# Patient Record
Sex: Male | Born: 1960 | Race: Black or African American | Hispanic: No | Marital: Single
Health system: Southern US, Community
[De-identification: ages and names within clinical notes are randomized; demographics above are authoritative.]

## PROBLEM LIST (undated history)

## (undated) DIAGNOSIS — E785 Hyperlipidemia, unspecified: Secondary | ICD-10-CM

## (undated) DIAGNOSIS — I10 Essential (primary) hypertension: Secondary | ICD-10-CM

## (undated) DIAGNOSIS — K859 Acute pancreatitis without necrosis or infection, unspecified: Secondary | ICD-10-CM

## (undated) DIAGNOSIS — I509 Heart failure, unspecified: Secondary | ICD-10-CM

## (undated) DIAGNOSIS — B1920 Unspecified viral hepatitis C without hepatic coma: Secondary | ICD-10-CM

## (undated) DIAGNOSIS — E119 Type 2 diabetes mellitus without complications: Secondary | ICD-10-CM

## (undated) HISTORY — PX: BELOW KNEE LEG AMPUTATION: SUR23

---

## 2009-07-08 HISTORY — PX: WHIPPLE PROCEDURE: SHX2667

## 2015-07-12 ENCOUNTER — Inpatient Hospital Stay (HOSPITAL_COMMUNITY)
Admission: AD | Admit: 2015-07-12 | Discharge: 2015-08-09 | DRG: 564 | Disposition: E | Payer: Medicare Other | Source: Other Acute Inpatient Hospital | Attending: Internal Medicine | Admitting: Internal Medicine

## 2015-07-12 ENCOUNTER — Inpatient Hospital Stay (HOSPITAL_COMMUNITY): Payer: Medicare Other

## 2015-07-12 ENCOUNTER — Encounter (HOSPITAL_COMMUNITY): Payer: Self-pay | Admitting: Pulmonary Disease

## 2015-07-12 DIAGNOSIS — D62 Acute posthemorrhagic anemia: Secondary | ICD-10-CM | POA: Diagnosis present

## 2015-07-12 DIAGNOSIS — I10 Essential (primary) hypertension: Secondary | ICD-10-CM | POA: Diagnosis present

## 2015-07-12 DIAGNOSIS — Z89512 Acquired absence of left leg below knee: Secondary | ICD-10-CM | POA: Diagnosis not present

## 2015-07-12 DIAGNOSIS — B951 Streptococcus, group B, as the cause of diseases classified elsewhere: Secondary | ICD-10-CM | POA: Diagnosis present

## 2015-07-12 DIAGNOSIS — G9341 Metabolic encephalopathy: Secondary | ICD-10-CM | POA: Diagnosis present

## 2015-07-12 DIAGNOSIS — G9340 Encephalopathy, unspecified: Secondary | ICD-10-CM | POA: Diagnosis not present

## 2015-07-12 DIAGNOSIS — Z978 Presence of other specified devices: Secondary | ICD-10-CM

## 2015-07-12 DIAGNOSIS — J189 Pneumonia, unspecified organism: Secondary | ICD-10-CM

## 2015-07-12 DIAGNOSIS — J9601 Acute respiratory failure with hypoxia: Secondary | ICD-10-CM | POA: Diagnosis present

## 2015-07-12 DIAGNOSIS — K529 Noninfective gastroenteritis and colitis, unspecified: Secondary | ICD-10-CM | POA: Diagnosis not present

## 2015-07-12 DIAGNOSIS — E440 Moderate protein-calorie malnutrition: Secondary | ICD-10-CM | POA: Diagnosis present

## 2015-07-12 DIAGNOSIS — I5032 Chronic diastolic (congestive) heart failure: Secondary | ICD-10-CM | POA: Diagnosis present

## 2015-07-12 DIAGNOSIS — A419 Sepsis, unspecified organism: Secondary | ICD-10-CM

## 2015-07-12 DIAGNOSIS — M868X6 Other osteomyelitis, lower leg: Secondary | ICD-10-CM | POA: Diagnosis present

## 2015-07-12 DIAGNOSIS — K7469 Other cirrhosis of liver: Secondary | ICD-10-CM

## 2015-07-12 DIAGNOSIS — R34 Anuria and oliguria: Secondary | ICD-10-CM | POA: Diagnosis not present

## 2015-07-12 DIAGNOSIS — I469 Cardiac arrest, cause unspecified: Secondary | ICD-10-CM | POA: Diagnosis not present

## 2015-07-12 DIAGNOSIS — R5381 Other malaise: Secondary | ICD-10-CM | POA: Diagnosis not present

## 2015-07-12 DIAGNOSIS — D696 Thrombocytopenia, unspecified: Secondary | ICD-10-CM | POA: Diagnosis present

## 2015-07-12 DIAGNOSIS — Z452 Encounter for adjustment and management of vascular access device: Secondary | ICD-10-CM

## 2015-07-12 DIAGNOSIS — E46 Unspecified protein-calorie malnutrition: Secondary | ICD-10-CM

## 2015-07-12 DIAGNOSIS — B1920 Unspecified viral hepatitis C without hepatic coma: Secondary | ICD-10-CM | POA: Diagnosis present

## 2015-07-12 DIAGNOSIS — I509 Heart failure, unspecified: Secondary | ICD-10-CM | POA: Diagnosis not present

## 2015-07-12 DIAGNOSIS — R001 Bradycardia, unspecified: Secondary | ICD-10-CM | POA: Diagnosis not present

## 2015-07-12 DIAGNOSIS — Y95 Nosocomial condition: Secondary | ICD-10-CM | POA: Diagnosis present

## 2015-07-12 DIAGNOSIS — L8990 Pressure ulcer of unspecified site, unspecified stage: Secondary | ICD-10-CM | POA: Insufficient documentation

## 2015-07-12 DIAGNOSIS — T879 Unspecified complications of amputation stump: Secondary | ICD-10-CM | POA: Diagnosis not present

## 2015-07-12 DIAGNOSIS — J156 Pneumonia due to other aerobic Gram-negative bacteria: Secondary | ICD-10-CM | POA: Diagnosis present

## 2015-07-12 DIAGNOSIS — R140 Abdominal distension (gaseous): Secondary | ICD-10-CM

## 2015-07-12 DIAGNOSIS — E785 Hyperlipidemia, unspecified: Secondary | ICD-10-CM | POA: Diagnosis present

## 2015-07-12 DIAGNOSIS — Z794 Long term (current) use of insulin: Secondary | ICD-10-CM

## 2015-07-12 DIAGNOSIS — J80 Acute respiratory distress syndrome: Secondary | ICD-10-CM | POA: Diagnosis not present

## 2015-07-12 DIAGNOSIS — E1169 Type 2 diabetes mellitus with other specified complication: Secondary | ICD-10-CM | POA: Diagnosis present

## 2015-07-12 DIAGNOSIS — K567 Ileus, unspecified: Secondary | ICD-10-CM | POA: Diagnosis not present

## 2015-07-12 DIAGNOSIS — R569 Unspecified convulsions: Secondary | ICD-10-CM | POA: Insufficient documentation

## 2015-07-12 DIAGNOSIS — K912 Postsurgical malabsorption, not elsewhere classified: Secondary | ICD-10-CM | POA: Diagnosis present

## 2015-07-12 DIAGNOSIS — Z4659 Encounter for fitting and adjustment of other gastrointestinal appliance and device: Secondary | ICD-10-CM

## 2015-07-12 DIAGNOSIS — N179 Acute kidney failure, unspecified: Secondary | ICD-10-CM | POA: Diagnosis not present

## 2015-07-12 DIAGNOSIS — L89152 Pressure ulcer of sacral region, stage 2: Secondary | ICD-10-CM | POA: Diagnosis not present

## 2015-07-12 DIAGNOSIS — L97929 Non-pressure chronic ulcer of unspecified part of left lower leg with unspecified severity: Secondary | ICD-10-CM | POA: Diagnosis present

## 2015-07-12 DIAGNOSIS — T8744 Infection of amputation stump, left lower extremity: Secondary | ICD-10-CM | POA: Diagnosis present

## 2015-07-12 DIAGNOSIS — E872 Acidosis: Secondary | ICD-10-CM | POA: Diagnosis not present

## 2015-07-12 DIAGNOSIS — G40409 Other generalized epilepsy and epileptic syndromes, not intractable, without status epilepticus: Secondary | ICD-10-CM | POA: Diagnosis not present

## 2015-07-12 DIAGNOSIS — Z79899 Other long term (current) drug therapy: Secondary | ICD-10-CM

## 2015-07-12 DIAGNOSIS — Z6821 Body mass index (BMI) 21.0-21.9, adult: Secondary | ICD-10-CM | POA: Diagnosis not present

## 2015-07-12 DIAGNOSIS — E11649 Type 2 diabetes mellitus with hypoglycemia without coma: Secondary | ICD-10-CM | POA: Diagnosis present

## 2015-07-12 DIAGNOSIS — Z66 Do not resuscitate: Secondary | ICD-10-CM | POA: Diagnosis not present

## 2015-07-12 DIAGNOSIS — T40605A Adverse effect of unspecified narcotics, initial encounter: Secondary | ICD-10-CM | POA: Diagnosis not present

## 2015-07-12 DIAGNOSIS — K56609 Unspecified intestinal obstruction, unspecified as to partial versus complete obstruction: Secondary | ICD-10-CM

## 2015-07-12 DIAGNOSIS — J9600 Acute respiratory failure, unspecified whether with hypoxia or hypercapnia: Secondary | ICD-10-CM

## 2015-07-12 DIAGNOSIS — K7460 Unspecified cirrhosis of liver: Secondary | ICD-10-CM | POA: Diagnosis present

## 2015-07-12 DIAGNOSIS — T68XXXA Hypothermia, initial encounter: Secondary | ICD-10-CM | POA: Diagnosis present

## 2015-07-12 DIAGNOSIS — K922 Gastrointestinal hemorrhage, unspecified: Secondary | ICD-10-CM

## 2015-07-12 DIAGNOSIS — J9690 Respiratory failure, unspecified, unspecified whether with hypoxia or hypercapnia: Secondary | ICD-10-CM | POA: Diagnosis present

## 2015-07-12 DIAGNOSIS — K2961 Other gastritis with bleeding: Secondary | ICD-10-CM | POA: Diagnosis present

## 2015-07-12 DIAGNOSIS — R0902 Hypoxemia: Secondary | ICD-10-CM | POA: Diagnosis present

## 2015-07-12 DIAGNOSIS — E876 Hypokalemia: Secondary | ICD-10-CM | POA: Diagnosis present

## 2015-07-12 HISTORY — DX: Hyperlipidemia, unspecified: E78.5

## 2015-07-12 HISTORY — DX: Unspecified viral hepatitis C without hepatic coma: B19.20

## 2015-07-12 HISTORY — DX: Heart failure, unspecified: I50.9

## 2015-07-12 HISTORY — DX: Essential (primary) hypertension: I10

## 2015-07-12 HISTORY — DX: Acute pancreatitis without necrosis or infection, unspecified: K85.90

## 2015-07-12 HISTORY — DX: Type 2 diabetes mellitus without complications: E11.9

## 2015-07-12 LAB — LACTIC ACID, PLASMA: Lactic Acid, Venous: 2.1 mmol/L (ref 0.5–2.0)

## 2015-07-12 LAB — GLUCOSE, CAPILLARY
GLUCOSE-CAPILLARY: 80 mg/dL (ref 65–99)
GLUCOSE-CAPILLARY: 89 mg/dL (ref 65–99)
Glucose-Capillary: 103 mg/dL — ABNORMAL HIGH (ref 65–99)
Glucose-Capillary: 114 mg/dL — ABNORMAL HIGH (ref 65–99)
Glucose-Capillary: 128 mg/dL — ABNORMAL HIGH (ref 65–99)
Glucose-Capillary: 154 mg/dL — ABNORMAL HIGH (ref 65–99)
Glucose-Capillary: 54 mg/dL — ABNORMAL LOW (ref 65–99)
Glucose-Capillary: <10 mg/dL — CL (ref 65–99)

## 2015-07-12 LAB — RAPID URINE DRUG SCREEN, HOSP PERFORMED
Amphetamines: NOT DETECTED
Barbiturates: NOT DETECTED
Benzodiazepines: POSITIVE — AB
Cocaine: NOT DETECTED
OPIATES: POSITIVE — AB
TETRAHYDROCANNABINOL: NOT DETECTED

## 2015-07-12 LAB — HEPATIC FUNCTION PANEL
ALT: 19 U/L (ref 17–63)
AST: 41 U/L (ref 15–41)
Albumin: <1 g/dL — ABNORMAL LOW (ref 3.5–5.0)
Alkaline Phosphatase: 919 U/L — ABNORMAL HIGH (ref 38–126)
BILIRUBIN DIRECT: 0.5 mg/dL (ref 0.1–0.5)
BILIRUBIN INDIRECT: 0.5 mg/dL (ref 0.3–0.9)
Total Bilirubin: 1 mg/dL (ref 0.3–1.2)
Total Protein: 5.5 g/dL — ABNORMAL LOW (ref 6.5–8.1)

## 2015-07-12 LAB — HEMOGLOBIN AND HEMATOCRIT, BLOOD
HCT: 27.2 % — ABNORMAL LOW (ref 39.0–52.0)
HEMATOCRIT: 24.3 % — AB (ref 39.0–52.0)
HEMOGLOBIN: 9.7 g/dL — AB (ref 13.0–17.0)
Hemoglobin: 8.6 g/dL — ABNORMAL LOW (ref 13.0–17.0)

## 2015-07-12 LAB — TRIGLYCERIDES: TRIGLYCERIDES: 128 mg/dL (ref <150)

## 2015-07-12 LAB — BASIC METABOLIC PANEL
Anion gap: 5 (ref 5–15)
Anion gap: 8 (ref 5–15)
BUN: 8 mg/dL (ref 6–20)
BUN: 10 mg/dL (ref 6–20)
CHLORIDE: 107 mmol/L (ref 101–111)
CHLORIDE: 104 mmol/L (ref 101–111)
CO2: 30 mmol/L (ref 22–32)
CO2: 26 mmol/L (ref 22–32)
CREATININE: 0.84 mg/dL (ref 0.61–1.24)
CREATININE: 0.99 mg/dL (ref 0.61–1.24)
Calcium: 7.2 mg/dL — ABNORMAL LOW (ref 8.9–10.3)
Calcium: 7 mg/dL — ABNORMAL LOW (ref 8.9–10.3)
GFR calc Af Amer: >60 mL/min (ref >60)
GFR calc non Af Amer: >60 mL/min (ref >60)
GFR calc non Af Amer: >60 mL/min (ref >60)
Glucose, Bld: <20 mg/dL — CL (ref 65–99)
Glucose, Bld: 135 mg/dL — ABNORMAL HIGH (ref 65–99)
Potassium: 2.8 mmol/L — ABNORMAL LOW (ref 3.5–5.1)
Potassium: 3.1 mmol/L — ABNORMAL LOW (ref 3.5–5.1)
SODIUM: 142 mmol/L (ref 135–145)
Sodium: 138 mmol/L (ref 135–145)

## 2015-07-12 LAB — PROTIME-INR
INR: 1.24 (ref 0.00–1.49)
PROTHROMBIN TIME: 15.8 s — AB (ref 11.6–15.2)

## 2015-07-12 LAB — POCT I-STAT 3, ART BLOOD GAS (G3+)
ACID-BASE EXCESS: 4 mmol/L — AB (ref 0.0–2.0)
BICARBONATE: 29.7 meq/L — AB (ref 20.0–24.0)
O2 Saturation: 99 %
PH ART: 7.403 (ref 7.350–7.450)
TCO2: 31 mmol/L (ref 0–100)
pCO2 arterial: 47.4 mmHg — ABNORMAL HIGH (ref 35.0–45.0)
pO2, Arterial: 145 mmHg — ABNORMAL HIGH (ref 80.0–100.0)

## 2015-07-12 LAB — HIV ANTIBODY (ROUTINE TESTING W REFLEX): HIV SCREEN 4TH GENERATION: NONREACTIVE

## 2015-07-12 LAB — CBC
HCT: 28.3 % — ABNORMAL LOW (ref 39.0–52.0)
Hemoglobin: 10 g/dL — ABNORMAL LOW (ref 13.0–17.0)
MCH: 29.2 pg (ref 26.0–34.0)
MCHC: 35.3 g/dL (ref 30.0–36.0)
MCV: 82.5 fL (ref 78.0–100.0)
PLATELETS: 127 10*3/uL — AB (ref 150–400)
RBC: 3.43 MIL/uL — ABNORMAL LOW (ref 4.22–5.81)
RDW: 14.6 % (ref 11.5–15.5)
WBC: 2.8 10*3/uL — AB (ref 4.0–10.5)

## 2015-07-12 LAB — PHOSPHORUS: Phosphorus: 3.1 mg/dL (ref 2.5–4.6)

## 2015-07-12 LAB — MRSA PCR SCREENING: MRSA by PCR: POSITIVE — AB

## 2015-07-12 LAB — PROCALCITONIN: PROCALCITONIN: 1.75 ng/mL

## 2015-07-12 LAB — MAGNESIUM: MAGNESIUM: 1.7 mg/dL (ref 1.7–2.4)

## 2015-07-12 MED ORDER — SODIUM CHLORIDE 0.9 % IV SOLN
80.0000 mg | Freq: Once | INTRAVENOUS | Status: DC
  Filled 2015-07-12: qty 80

## 2015-07-12 MED ORDER — POTASSIUM CHLORIDE 10 MEQ/50ML IV SOLN
10.0000 meq | INTRAVENOUS | Status: DC
  Administered 2015-07-12 – 2015-07-13 (×5): 10 meq via INTRAVENOUS
  Filled 2015-07-12 (×6): qty 50

## 2015-07-12 MED ORDER — OXYBUTYNIN 3.9 MG/24HR TD PTTW
1.0000 | MEDICATED_PATCH | TRANSDERMAL | Status: DC
  Administered 2015-07-12: 1 via TRANSDERMAL
  Filled 2015-07-12 (×2): qty 1

## 2015-07-12 MED ORDER — DEXTROSE 10 % IV SOLN
INTRAVENOUS | Status: DC
  Administered 2015-07-12 (×2): via INTRAVENOUS

## 2015-07-12 MED ORDER — FENTANYL CITRATE (PF) 100 MCG/2ML IJ SOLN
100.0000 ug | INTRAMUSCULAR | Status: DC | PRN
  Administered 2015-07-13 (×2): 100 ug via INTRAVENOUS
  Filled 2015-07-12 (×2): qty 2

## 2015-07-12 MED ORDER — MAGNESIUM SULFATE IN D5W 10-5 MG/ML-% IV SOLN
1.0000 g | Freq: Once | INTRAVENOUS | Status: AC
  Administered 2015-07-12: 1 g via INTRAVENOUS
  Filled 2015-07-12: qty 100

## 2015-07-12 MED ORDER — DEXTROSE 50 % IV SOLN
INTRAVENOUS | Status: AC
Start: 2015-07-12 — End: 2015-07-12
  Administered 2015-07-12: 50 mL
  Filled 2015-07-12: qty 50

## 2015-07-12 MED ORDER — LABETALOL HCL 5 MG/ML IV SOLN
10.0000 mg | INTRAVENOUS | Status: DC | PRN
Start: 2015-07-12 — End: 2015-07-27
  Administered 2015-07-12 – 2015-07-23 (×7): 10 mg via INTRAVENOUS
  Filled 2015-07-12 (×9): qty 4

## 2015-07-12 MED ORDER — CHLORHEXIDINE GLUCONATE 0.12% ORAL RINSE (MEDLINE KIT)
15.0000 mL | Freq: Two times a day (BID) | OROMUCOSAL | Status: DC
  Administered 2015-07-12 – 2015-07-26 (×30): 15 mL via OROMUCOSAL

## 2015-07-12 MED ORDER — VANCOMYCIN HCL IN DEXTROSE 1-5 GM/200ML-% IV SOLN
1000.0000 mg | Freq: Three times a day (TID) | INTRAVENOUS | Status: DC
  Administered 2015-07-12 – 2015-07-13 (×3): 1000 mg via INTRAVENOUS
  Filled 2015-07-12 (×5): qty 200

## 2015-07-12 MED ORDER — THIAMINE HCL 100 MG/ML IJ SOLN
100.0000 mg | Freq: Every day | INTRAMUSCULAR | Status: DC
  Administered 2015-07-12 – 2015-07-13 (×2): 100 mg via INTRAVENOUS
  Filled 2015-07-12 (×2): qty 2

## 2015-07-12 MED ORDER — FENTANYL CITRATE (PF) 100 MCG/2ML IJ SOLN: 100.0000 ug | INTRAMUSCULAR | Status: DC | PRN

## 2015-07-12 MED ORDER — MUPIROCIN 2 % EX OINT
1.0000 "application " | TOPICAL_OINTMENT | Freq: Two times a day (BID) | CUTANEOUS | Status: AC
  Administered 2015-07-12 – 2015-07-16 (×10): 1 via NASAL
  Filled 2015-07-12 (×2): qty 22

## 2015-07-12 MED ORDER — PROPOFOL 1000 MG/100ML IV EMUL
0.0000 ug/kg/min | INTRAVENOUS | Status: DC
  Administered 2015-07-12: 35 ug/kg/min via INTRAVENOUS
  Administered 2015-07-13 – 2015-07-15 (×3): 10 ug/kg/min via INTRAVENOUS
  Administered 2015-07-16: 20 ug/kg/min via INTRAVENOUS
  Administered 2015-07-16: 25 ug/kg/min via INTRAVENOUS
  Administered 2015-07-17 (×2): 20 ug/kg/min via INTRAVENOUS
  Administered 2015-07-17: 25 ug/kg/min via INTRAVENOUS
  Administered 2015-07-18: 10 ug/kg/min via INTRAVENOUS
  Filled 2015-07-12 (×12): qty 100

## 2015-07-12 MED ORDER — INSULIN ASPART 100 UNIT/ML ~~LOC~~ SOLN: 2.0000 [IU] | SUBCUTANEOUS | Status: DC

## 2015-07-12 MED ORDER — POTASSIUM CHLORIDE 10 MEQ/50ML IV SOLN
10.0000 meq | INTRAVENOUS | Status: AC
  Administered 2015-07-12 (×4): 10 meq via INTRAVENOUS

## 2015-07-12 MED ORDER — CHLORHEXIDINE GLUCONATE CLOTH 2 % EX PADS
6.0000 | MEDICATED_PAD | Freq: Every day | CUTANEOUS | Status: AC
Start: 2015-07-12 — End: 2015-07-16
  Administered 2015-07-12 – 2015-07-16 (×5): 6 via TOPICAL

## 2015-07-12 MED ORDER — SODIUM CHLORIDE 0.9 % IJ SOLN
10.0000 mL | Freq: Two times a day (BID) | INTRAMUSCULAR | Status: DC
  Administered 2015-07-12 – 2015-07-16 (×7): 10 mL
  Administered 2015-07-17: 40 mL
  Administered 2015-07-18 – 2015-07-24 (×10): 10 mL
  Administered 2015-07-25: 20 mL

## 2015-07-12 MED ORDER — DEXTROSE 50 % IV SOLN
INTRAVENOUS | Status: AC
  Administered 2015-07-12: 50 mL
  Filled 2015-07-12: qty 50

## 2015-07-12 MED ORDER — PROPOFOL 1000 MG/100ML IV EMUL
0.0000 ug/kg/min | INTRAVENOUS | Status: DC
  Administered 2015-07-12: 35 ug/kg/min via INTRAVENOUS
  Administered 2015-07-12: 25 ug/kg/min via INTRAVENOUS

## 2015-07-12 MED ORDER — FOLIC ACID 5 MG/ML IJ SOLN
1.0000 mg | Freq: Every day | INTRAMUSCULAR | Status: DC
  Administered 2015-07-12 – 2015-07-13 (×2): 1 mg via INTRAVENOUS
  Filled 2015-07-12 (×3): qty 0.2

## 2015-07-12 MED ORDER — SODIUM CHLORIDE 0.9 % IV SOLN
8.0000 mg/h | INTRAVENOUS | Status: AC
  Administered 2015-07-12 – 2015-07-15 (×6): 8 mg/h via INTRAVENOUS
  Filled 2015-07-12 (×15): qty 80

## 2015-07-12 MED ORDER — ALBUTEROL SULFATE (2.5 MG/3ML) 0.083% IN NEBU: 2.5000 mg | INHALATION_SOLUTION | RESPIRATORY_TRACT | Status: DC | PRN

## 2015-07-12 MED ORDER — POTASSIUM CHLORIDE 10 MEQ/50ML IV SOLN
INTRAVENOUS | Status: AC
  Filled 2015-07-12: qty 200

## 2015-07-12 MED ORDER — SODIUM CHLORIDE 0.9 % IJ SOLN
10.0000 mL | INTRAMUSCULAR | Status: DC | PRN
  Administered 2015-07-14: 10 mL
  Administered 2015-07-23: 20 mL
  Administered 2015-07-23: 10 mL
  Administered 2015-07-24: 30 mL
  Administered 2015-07-24 – 2015-07-25 (×2): 10 mL
  Filled 2015-07-12 (×6): qty 40

## 2015-07-12 MED ORDER — PROPOFOL 1000 MG/100ML IV EMUL
INTRAVENOUS | Status: AC
  Filled 2015-07-12: qty 100

## 2015-07-12 MED ORDER — PIPERACILLIN-TAZOBACTAM 3.375 G IVPB
3.3750 g | Freq: Three times a day (TID) | INTRAVENOUS | Status: DC
  Administered 2015-07-12 – 2015-07-15 (×9): 3.375 g via INTRAVENOUS
  Filled 2015-07-12 (×11): qty 50

## 2015-07-12 MED ORDER — PANTOPRAZOLE SODIUM 40 MG IV SOLR
40.0000 mg | Freq: Two times a day (BID) | INTRAVENOUS | Status: DC
  Administered 2015-07-15 – 2015-07-20 (×11): 40 mg via INTRAVENOUS
  Filled 2015-07-12 (×11): qty 40

## 2015-07-12 MED ORDER — ANTISEPTIC ORAL RINSE SOLUTION (CORINZ)
7.0000 mL | OROMUCOSAL | Status: DC
  Administered 2015-07-12 – 2015-07-27 (×134): 7 mL via OROMUCOSAL

## 2015-07-12 MED ORDER — PANTOPRAZOLE SODIUM 40 MG IV SOLR
40.0000 mg | Freq: Every day | INTRAVENOUS | Status: DC
Start: 2015-07-12 — End: 2015-07-12

## 2015-07-12 NOTE — Progress Notes (Signed)
Hypoglycemic Event  CBG: 54  Treatment: D50 IV 50 mL  Symptoms: None  Follow-up CBG: Time:1412 CBG Result:154  Possible Reasons for Event: Unknown  Comments/MD notified: MD aware    Victor Mcdonald, Victor Mcdonald

## 2015-07-12 NOTE — Progress Notes (Signed)
ANTIBIOTIC CONSULT NOTE - INITIAL  Pharmacy Consult for zosyn + vancomycin Indication: rule out pneumonia  No Known Allergies  Patient Measurements:   Adjusted Body Weight:   Vital Signs: BP: 184/108 mmHg (01/04 0733) Pulse Rate: 88 (01/04 0733) Intake/Output from previous day:   Intake/Output from this shift:    Labs:  Recent Labs  2016/03/16 0902  WBC 2.8*  HGB 10.0*  PLT 127*  CREATININE 0.84   CrCl cannot be calculated (Unknown ideal weight.). No results for input(s): VANCOTROUGH, VANCOPEAK, VANCORANDOM, GENTTROUGH, GENTPEAK, GENTRANDOM, TOBRATROUGH, TOBRAPEAK, TOBRARND, AMIKACINPEAK, AMIKACINTROU, AMIKACIN in the last 72 hours.   Microbiology: Recent Results (from the past 720 hour(s))  MRSA PCR Screening     Status: Abnormal   Collection Time: 2016/03/16  6:30 AM  Result Value Ref Range Status   MRSA by PCR POSITIVE (A) NEGATIVE Final    Comment:        The GeneXpert MRSA Assay (FDA approved for NASAL specimens only), is one component of a comprehensive MRSA colonization surveillance program. It is not intended to diagnose MRSA infection nor to guide or monitor treatment for MRSA infections. RESULT CALLED TO, READ BACK BY AND VERIFIED WITH: Gerhard Perches PARKER,RN AT [id number] 08/07/2015 BY L BENFIELD     Medical History: Past Medical History  Diagnosis Date  . Hepatitis C   . Hypertension   . Hyperlipidemia   . CHF (congestive heart failure) (HCC)   . Pancreatitis   . Diabetes mellitus type 2 in nonobese [facility](HCC)     Medications:  Anti-infectives    Start     Dose/Rate Route Frequency Ordered Stop   2016/03/16 1100  piperacillin-tazobactam (ZOSYN) IVPB 3.375 g     3.375 g 12.5 mL/hr over 240 Minutes Intravenous Every 8 hours 2016/03/16 0959     2016/03/16 1100  vancomycin (VANCOCIN) IVPB 1000 mg/200 mL premix     1,000 mg 200 mL/hr over 60 Minutes Intravenous Every 8 hours 2016/03/16 0959       Assessment: 54 yom transferred from OSH with respiratory failure and  possible GIB. To start empiric vancomycin and zosyn. Apparently received first doses prior to transfer but unclear on timing of these doses. WBC is low and Scr is WNL. Cultures are pending.   Vanc 1/4>> Zosyn 1/4>>  Goal of Therapy:  Vancomycin trough level 15-20 mcg/ml  Plan:  - Zosyn 3.375gm IV Q8H (4 hr inf) - Vancomycin 1gm IV Q8H - F/u renal fxn, C&S, clinical status and [facility]  Aishwarya Shiplett, Drake Leachachel Lynn 07/18/2015,10:00 AM

## 2015-07-12 NOTE — Progress Notes (Signed)
MD notified of Lactic Acid result, decreased urine output and Potassium level. Orders received for 6 runs of K. Will continue to monitor pt closely.

## 2015-07-12 NOTE — H&P (Addendum)
PULMONARY / CRITICAL CARE MEDICINE   Name: Victor Mcdonald MRN: [id number] DOB: 02-05-1961    ADMISSION DATE:  07-30-15  REFERRING MD:  Outside hospital ER  CHIEF COMPLAINT:  Can't breath  HISTORY OF PRESENT ILLNESS:   Hx from chart.  Pt unable to provide hx.  55 yo male was brought to ER.  He was hypoxia.  He required intubation.  At time of intubation he was noted to have hematemesis.  Reported that stool occult blood was negative.  He was anemic and given PRBC transfusion.  He was noted to be hypoglycemic.  His CXR showed infiltrates.  He had blood cx's done at outside hospital, and given vancomycin, zosyn.  He had CT head that was negative.  He was transferred to [facility].  In [facility] he did not have any further bloody NG tube outpt.  He was still hypoglycemic.  He was also hypertensive.  PAST MEDICAL HISTORY :  He  has a past medical history of Hepatitis C; Hypertension; Hyperlipidemia; CHF (congestive heart failure) (HCC); Pancreatitis; and Diabetes mellitus type 2 in nonobese (HCC).  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  No Known Allergies  No current facility-administered medications on file prior to encounter.   No current outpatient prescriptions on file prior to encounter.    FAMILY HISTORY:  Unable to obtain due to pt being intubated.  SOCIAL HISTORY: Unable to obtain due to pt being intubated.  REVIEW OF SYSTEMS:   Unable to obtain due to pt being intubated. SUBJECTIVE:  Sedated.  VITAL SIGNS: BP 168/102 mmHg  Pulse 88  Resp 14  SpO2 95%  HEMODYNAMICS:    VENTILATOR SETTINGS: Vent Mode:  [-] PRVC FiO2 (%):  [40 %] 40 % Set Rate:  [14 bmp] 14 bmp Vt Set:  [510 mL] 510 mL PEEP:  [5 cmH20] 5 cmH20 Plateau Pressure:  [22 cmH20] 22 cmH20  INTAKE / OUTPUT:    PHYSICAL EXAMINATION: General: thin Neuro:  RASS -3 HEENT:  Pupils pinpoint, ETT/OG in  place Cardiovascular:  Regular, no murmur Lungs:  B/l crackles, no wheeze Abdomen:  Soft, mid-line scar, non tender, decreased bowel sounds Musculoskeletal:  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 Skin: no rashes  LABS:  BMET No results for input(s): NA, K, CL, CO2, BUN, CREATININE, GLUCOSE in the last 168 hours.  Electrolytes No results for input(s): CALCIUM, MG, PHOS in the last 168 hours.  CBC No results for input(s): WBC, HGB, HCT, PLT in the last 168 hours.  Coag's No results for input(s): APTT, INR in the last 168 hours.  Sepsis Markers No results for input(s): LATICACIDVEN, PROCALCITON, O2SATVEN in the last 168 hours.  ABG  Recent Labs Lab 30-Jul-2015 0639  PHART 7.403  PCO2ART 47.4*  PO2ART 145.0*    Liver Enzymes No results for input(s): AST, ALT, ALKPHOS, BILITOT, ALBUMIN in the last 168 hours.  Cardiac Enzymes No results for input(s): TROPONINI, PROBNP in the last 168 hours.  Glucose No results for input(s): GLUCAP in the last 168 hours.  Imaging Dg Chest Port 1 View  07/30/15  CLINICAL DATA:  Ventilator dependent respiratory failure EXAM: PORTABLE CHEST 1 VIEW COMPARISON:  None. FINDINGS: Endotracheal tube tip is 3 cm above the carina. An orogastric tube is coiled in the fundus. There are EKG leads over the chest which create artifact. Diffuse airspace disease, asymmetrically dense around the left hilum. Possible layering pleural  effusions. No pneumothorax. Accounting for technique, heart size is borderline enlarged. IMPRESSION: 1. Unremarkable positioning of endotracheal and orogastric tubes. 2. Extensive, asymmetric airspace disease favoring pneumonia. Electronically Signed   By: Marnee SpringJonathon  Watts M.D.   On: Oct 07, 2015 07:31   Dg Knee Left Port  07/14/2015  CLINICAL DATA:  Infection of left stump, below the knee amputation stump complication. EXAM: PORTABLE LEFT KNEE - 1-2 VIEW COMPARISON:  None. FINDINGS: Soft tissue defect overlies a  below-the-knee amputation. There is some cortical regularity along the tibial surgical margin but no prior exams for comparison. No joint effusion. Vascular calcifications. IMPRESSION: Soft tissue defect overlies the tibial surgical margin. Associated cortical haziness and irregularity, without comparison exam. Osteomyelitis cannot be excluded. Electronically Signed   By: Leanna BattlesMelinda  Blietz M.D.   On: Oct 07, 2015 08:15   Labs from outside hospital 09/10/2015 >> Hb 7, PLT 213, WBC 11.1, BNP 854, Alb 1.4, Bili 0.5, ALP 1070, AST 50, ALT 22, lipase 16, Lactic acid 1.12, Na 143, K 3.3, BUN 11, Creat 0.8. CO2 30, Cl 103, Troponin 0.03, Phos 3.1  STUDIES:  1/04 CT head >> negative  CULTURES: 1/04 Blood cx (OSH) >> 1/04 Sputum >>  ANTIBIOTICS: 1/04 Blood >> 1/04 Sputum >>  SIGNIFICANT EVENTS: 1/04 Transfer from OSH  LINES/TUBES: 1/04 ETT >> 1/04 CVL >>   DISCUSSION: 55 yo male transferred from outside hospital with acute hypoxic respiratory failure, b/l pulmonary infiltrates, possible upper GI bleeding, acute encephalopathy, hypoglycemia, and possible Lt BKA stump infection.  ASSESSMENT / PLAN:  PULMONARY A: Acute hypoxic respiratory failure. P:   Full vent support F/u CXR, ABG PRN albuterol  CARDIOVASCULAR A:  Hypertension. Hx of HLD, chronic diastolic CHF. P:  PRN labetalol for SBP > 170  RENAL A:   No acute issues. P:   Monitor renal fx, urine outpt, electrolytes  GASTROINTESTINAL A:   ?Upper GI bleed. Hx of Hep C. Hx of pancreatitis. S/p Whipple procedure in 2011. Protein calorie malnutrition. P:   NPO Protonix gtt Consult GI  Abd u/s to assess for cirrhosis F/u LFT's  HEMATOLOGIC A:   Acute blood loss anemia from reported upper GI bleed. 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 P:   Xray Lt knee Wound care Check procalcitonin Day 1 vancomycin,  zosyn Ortho consulted 1/04 for assessment of Lt BKA  ENDOCRINE A:   Hypoglycemia. Hx of DM type II. P:   D10 in IV fluids for now SSI  NEUROLOGIC A:   Acute metabolic encephalopathy. P:   RASS goal: -1  Family contact - Hassie BruceBrother, Michael >> [phone] >> no answer.  CC time 42 minutes.  Coralyn HellingVineet Cuinn Westerhold, MD [facility] 07/13/2015, 8:24 AM Pager:  [phone] After 3pm call: [phone]

## 2015-07-12 NOTE — Consult Note (Addendum)
UNASSIGNED PATIENT Reason for Consult:Coffe ground emesis.  Referring Physician: Dr. Chesley Mires.  Victor Mcdonald is an 55 y.o. male.  HPI: 55 year old black male admitted from an outside facility was shortness of breath. Not much history available as there are no family contacts available. GI consultation was requested as he ahd CGE on intubation and reportedly 1300 cc were drained after the orogastric tube was placed. There was no hematemesis noted as per his nurse.     Past Medical History  Diagnosis Date  . Hepatitis C   . Hypertension   . Hyperlipidemia   . CHF (congestive heart failure) ([place])   . Pancreatitis   . Diabetes mellitus type 2 in nonobese [facility])    Past Surgical History  Procedure Laterality Date  . Below knee leg amputation Left   . Whipple procedure  2011   No family history on file.  Social History:  has no tobacco, alcohol, and drug history on file.  Allergies: No Known Allergies  Medications: I have reviewed the patient's current medications.  Results for orders placed or performed during the hospital encounter of 07/13/2015 (from the past 48 hour(s))  MRSA PCR Screening     Status: Abnormal   Collection Time: 07/17/2015  6:30 AM  Result Value Ref Range   MRSA by PCR POSITIVE (A) NEGATIVE    Comment:        The GeneXpert MRSA Assay (FDA approved for NASAL specimens only), is one component of a comprehensive MRSA colonization surveillance program. It is not intended to diagnose MRSA infection nor to guide or monitor treatment for MRSA infections. RESULT CALLED TO, READ BACK BY AND VERIFIED WITH: Ramiro Harvest AT [id number] 07/10/2015 BY L BENFIELD   I-STAT 3, arterial blood gas (G3+)     Status: Abnormal   Collection Time: 07/13/2015  6:39 AM  Result Value Ref Range   pH, Arterial 7.403 7.350 - 7.450   pCO2 arterial 47.4 (H) 35.0 - 45.0 mmHg   pO2, Arterial 145.0 (H) 80.0 - 100.0 mmHg   Bicarbonate 29.7 (H) 20.0 - 24.0 mEq/L   TCO2 31 0 - 100 mmol/L   O2  Saturation 99.0 %   Acid-Base Excess 4.0 (H) 0.0 - 2.0 mmol/L   Patient temperature 97.5 F    Collection site RADIAL, ALLEN'S TEST ACCEPTABLE    Drawn by RT    Sample type ARTERIAL   Glucose, capillary     Status: Abnormal   Collection Time: 07/23/2015  7:03 AM  Result Value Ref Range   Glucose-Capillary <10 (LL) 65 - 99 mg/dL   Comment 1 Capillary Specimen    Comment 2 Notify RN    Comment 3 Document in Chart   Glucose, capillary     Status: Abnormal   Collection Time: 07/20/2015  7:05 AM  Result Value Ref Range   Glucose-Capillary <10 (LL) 65 - 99 mg/dL   Comment 1 Capillary Specimen    Comment 2 Notify RN    Comment 3 Document in Chart   Urine rapid drug screen (hosp performed)     Status: Abnormal   Collection Time: 07/25/2015  7:35 AM  Result Value Ref Range   Opiates POSITIVE (A) NONE DETECTED   Cocaine NONE DETECTED NONE DETECTED   Benzodiazepines POSITIVE (A) NONE DETECTED   Amphetamines NONE DETECTED NONE DETECTED   Tetrahydrocannabinol NONE DETECTED NONE DETECTED   Barbiturates NONE DETECTED NONE DETECTED    Comment:        DRUG SCREEN FOR  MEDICAL PURPOSES ONLY.  IF CONFIRMATION IS NEEDED FOR ANY PURPOSE, NOTIFY LAB WITHIN 5 DAYS.        LOWEST DETECTABLE LIMITS FOR URINE DRUG SCREEN Drug Class       Cutoff (ng/mL) Amphetamine      1000 Barbiturate      200 Benzodiazepine   892 Tricyclics       119 Opiates          300 Cocaine          300 THC              50   Basic metabolic panel     Status: Abnormal   Collection Time: 07/11/2015  9:02 AM  Result Value Ref Range   Sodium 142 135 - 145 mmol/L   Potassium 2.8 (L) 3.5 - 5.1 mmol/L   Chloride 107 101 - 111 mmol/L   CO2 30 22 - 32 mmol/L   Glucose, Bld <20 (LL) 65 - 99 mg/dL    Comment: CRITICAL RESULT CALLED TO, READ BACK BY AND VERIFIED WITH: PARKER,T RN @ [phone] 08/06/2015 LEONARD,A    BUN 8 6 - 20 mg/dL   Creatinine, Ser 0.84 0.61 - 1.24 mg/dL   Calcium 7.2 (L) 8.9 - 10.3 mg/dL   GFR calc non Af Amer >60 >60  mL/min   GFR calc Af Amer >60 >60 mL/min    Comment: (NOTE) The eGFR has been calculated using the CKD EPI equation. This calculation has not been validated in all clinical situations. eGFR's persistently <60 mL/min signify possible Chronic Kidney Disease.    Anion gap 5 5 - 15  CBC     Status: Abnormal   Collection Time: 07/11/2015  9:02 AM  Result Value Ref Range   WBC 2.8 (L) 4.0 - 10.5 K/uL   RBC 3.43 (L) 4.22 - 5.81 MIL/uL   Hemoglobin 10.0 (L) 13.0 - 17.0 g/dL   HCT 28.3 (L) 39.0 - 52.0 %   MCV 82.5 78.0 - 100.0 fL   MCH 29.2 26.0 - 34.0 pg   MCHC 35.3 30.0 - 36.0 g/dL   RDW 14.6 11.5 - 15.5 %   Platelets 127 (L) 150 - 400 K/uL  Triglycerides     Status: None   Collection Time: 07/10/2015  9:03 AM  Result Value Ref Range   Triglycerides 128 <150 mg/dL  Procalcitonin - Baseline     Status: None   Collection Time: 07/22/2015  9:03 AM  Result Value Ref Range   Procalcitonin 1.75 ng/mL    Comment:        Interpretation: PCT > 0.5 ng/mL and <= 2 ng/mL: Systemic infection (sepsis) is possible, but other conditions are known to elevate PCT as well. (NOTE)         ICU PCT Algorithm               Non ICU PCT Algorithm    ----------------------------     ------------------------------         PCT < 0.25 ng/mL                 PCT < 0.1 ng/mL     Stopping of antibiotics            Stopping of antibiotics       strongly encouraged.               strongly encouraged.    ----------------------------     ------------------------------       PCT  level decrease by               PCT < 0.25 ng/mL       >= 80% from peak PCT       OR PCT 0.25 - 0.5 ng/mL          Stopping of antibiotics                                             encouraged.     Stopping of antibiotics           encouraged.    ----------------------------     ------------------------------       PCT level decrease by              PCT >= 0.25 ng/mL       < 80% from peak PCT        AND PCT >= 0.5 ng/mL              Continuing antibiotics                                              encouraged.       Continuing antibiotics            encouraged.    ----------------------------     ------------------------------     PCT level increase compared          PCT > 0.5 ng/mL         with peak PCT AND          PCT >= 0.5 ng/mL             Escalation of antibiotics                                          strongly encouraged.      Escalation of antibiotics        strongly encouraged.   Hepatic function panel     Status: Abnormal   Collection Time: 08/06/2015  9:03 AM  Result Value Ref Range   Total Protein 5.5 (L) 6.5 - 8.1 g/dL   Albumin <1.0 (L) 3.5 - 5.0 g/dL   AST 41 15 - 41 U/L   ALT 19 17 - 63 U/L   Alkaline Phosphatase 919 (H) 38 - 126 U/L   Total Bilirubin 1.0 0.3 - 1.2 mg/dL   Bilirubin, Direct 0.5 0.1 - 0.5 mg/dL   Indirect Bilirubin 0.5 0.3 - 0.9 mg/dL  Protime-INR     Status: Abnormal   Collection Time: 08/01/2015  9:03 AM  Result Value Ref Range   Prothrombin Time 15.8 (H) 11.6 - 15.2 seconds   INR 1.24 0.00 - 1.49  Phosphorus     Status: None   Collection Time: 08/02/2015  9:03 AM  Result Value Ref Range   Phosphorus 3.1 2.5 - 4.6 mg/dL  Magnesium     Status: None   Collection Time: 07/30/2015  9:03 AM  Result Value Ref Range   Magnesium 1.7 1.7 - 2.4 mg/dL   Dg Chest Port 1 View  07/10/2015  CLINICAL DATA:  Central line placement. Ventilator dependent respiratory  failure. EXAM: PORTABLE CHEST 1 VIEW 9:36 a.m. COMPARISON:  07/10/2015 at 7:08 a.m. FINDINGS: Left jugular vein catheter has been inserted. The tip extends into the superior vena cava and extends slightly cranially. Endotracheal tube and OG tube appear in good position. Extensive bilateral pulmonary infiltrates are again noted. No pneumothorax. IMPRESSION: Central line tip is in the superior vena cava but is directed cranially. Extensive bilateral pulmonary infiltrates, essentially unchanged. Electronically Signed   By: Lorriane Shire  M.D.   On: 07/23/2015 09:47   Dg Chest Port 1 View  08/03/2015  CLINICAL DATA:  Ventilator dependent respiratory failure EXAM: PORTABLE CHEST 1 VIEW COMPARISON:  None. FINDINGS: Endotracheal tube tip is 3 cm above the carina. An orogastric tube is coiled in the fundus. There are EKG leads over the chest which create artifact. Diffuse airspace disease, asymmetrically dense around the left hilum. Possible layering pleural effusions. No pneumothorax. Accounting for technique, heart size is borderline enlarged. IMPRESSION: 1. Unremarkable positioning of endotracheal and orogastric tubes. 2. Extensive, asymmetric airspace disease favoring pneumonia. Electronically Signed   By: Monte Fantasia M.D.   On: 07/11/2015 07:31   Dg Knee Left Port  07/14/2015  CLINICAL DATA:  Infection of left stump, below the knee amputation stump complication. EXAM: PORTABLE LEFT KNEE - 1-2 VIEW COMPARISON:  None. FINDINGS: Soft tissue defect overlies a below-the-knee amputation. There is some cortical regularity along the tibial surgical margin but no prior exams for comparison. No joint effusion. Vascular calcifications. IMPRESSION: Soft tissue defect overlies the tibial surgical margin. Associated cortical haziness and irregularity, without comparison exam. Osteomyelitis cannot be excluded. Electronically Signed   By: Lorin Picket M.D.   On: 07/28/2015 08:15   US Abdomen Limited Ruq  07/11/2015  CLINICAL DATA:  Hepatitis-C, history of Whipple procedure EXAM: US ABDOMEN LIMITED - RIGHT UPPER QUADRANT COMPARISON:  None. FINDINGS: Gallbladder: Surgically absent Common bile duct: Diameter: 3.3 mm in diameter within normal limits. Liver: No focal lesion identified. Within normal limits in parenchymal echogenicity. Right pleural effusion is noted. There is small abdominal ascites. IMPRESSION: 1. Surgically absent gallbladder. Normal CBD. No focal hepatic mass. Right pleural effusion is noted. Small abdominal ascites. Electronically  Signed   By: Lahoma Crocker M.D.   On: 07/30/2015 10:54   Review of Systems  Unable to perform ROS  Blood pressure 155/95, pulse 70, resp. rate 14, weight 61.145 kg (134 lb 12.8 oz), SpO2 95 %. Physical Exam  Constitutional: He appears cachectic. He has a sickly appearance. He is intubated.  Eyes: EOM and lids are normal. Right conjunctiva is injected.  Neck: No thyroid mass and no thyromegaly present.  Cardiovascular: Normal rate, S1 normal and S2 normal.   Respiratory: He is intubated. He has decreased breath sounds.  Bilateral rhonchi, no wheezing  GI: Soft. Normal appearance. He exhibits no distension, no fluid wave and no ascites. Bowel sounds are decreased.  BKA on the left side.  Assessment/Plan: 1) Coffee ground emesis.anemia-probably stress gastritis-agree with IV Protonix for now. Patient will benefit from a EGD as he has a history of cirrhosis secondary to Hepatitis C but he is acutely ill due to his repiratory failure/pneumonia and therefore plans are to hold off on performing an EGD till his respiratory status improves.  2) Severe malnutrition.   3) Hepatitis C/cirrhosis/elevated alkaline phosphatase. 4) HCAP/acute respiratory failure.  5) Left BKA stump. Victor Mcdonald 07/25/2015, 12:20 PM

## 2015-07-12 NOTE — Progress Notes (Signed)
eLink Physician-Brief Progress Note Patient Name: Victor CornfieldRobert Mcdonald DOB: [birth year] MRN: [id number]   Date of Service  07/24/2015  HPI/Events of Note  Labs show very mild LA (2.1) & persistent hypokalemia to 3.1 after 4 runs.  eICU Interventions  Trending LA. KCl 6 runs of 10.     Intervention Category Intermediate Interventions: Electrolyte abnormality - evaluation and management  Lawanda CousinsJennings Reagyn Facemire 07/11/2015, 6:52 PM

## 2015-07-12 NOTE — Progress Notes (Signed)
eLink Physician-Brief Progress Note Patient Name: Victor CornfieldRobert Mcdonald DOB: 11-Jun-1961 MRN: [id number]   Date of Service  07/31/2015  HPI/Events of Note  RN notified of ongoing hypothermia & hypoglycemia despite d10 at 50cc/hr.  eICU Interventions  1. 1 gm magnesium sulfate IV 2. BMP now 3. Lactic acid now then q6hr 4. Bear hugger     Intervention Category Intermediate Interventions: Other:  Victor Mcdonald 07/14/2015, 4:39 PM

## 2015-07-12 NOTE — Consult Note (Signed)
ORTHOPAEDIC CONSULTATION  REQUESTING PHYSICIAN: Chesley Mires, MD  Chief Complaint: L BKA wound  HPI: Victor Mcdonald is a 55 y.o. male who presents to [facility] after being transferred from [facility].  The patient remains intubated and no family was present to provide background information, so medical hx is limited to records from [facility]. Per records, the patient was hypoxic when EMS arrived to the scene.  The patient did not tolerate CPAP, so was intubated in the field.  On intubation, the patient was noted to have hematemesis.  CXR revealed infiltrates.    Patient was also noted to have a actively draining wound from a L BKA.  Imaging was concerning for possible osteomyelitis.  Wound care was consulted.  They signed off stating out of the scope of their management.    Past Medical History  Diagnosis Date  . Hepatitis C   . Hypertension   . Hyperlipidemia   . CHF (congestive heart failure) ([place])   . Pancreatitis   . Diabetes mellitus type 2 in nonobese [facility])    Past Surgical History  Procedure Laterality Date  . Below knee leg amputation Left   . Whipple procedure  2011   Social History   Social History  . Marital Status: Single    Spouse Name: N/A  . Number of Children: N/A  . Years of Education: N/A   Social History Main Topics  . Smoking status: Unknown If Ever Smoked  . Smokeless tobacco: Not on file  . Alcohol Use: Not on file  . Drug Use: Not on file  . Sexual Activity: Not on file   Other Topics Concern  . Not on file   Social History Narrative  . No narrative on file   No family history on file. No Known Allergies Prior to Admission medications   Medication Sig Start Date End Date Taking? Authorizing Provider  furosemide (LASIX) 20 MG tablet Take 20 mg by mouth daily. 07/03/15   Historical Provider, MD  LEVEMIR FLEXTOUCH 100 UNIT/ML Pen Inject 30 Units into the skin daily. 07/05/15   Historical Provider, MD  lisinopril (PRINIVIL,ZESTRIL)  20 MG tablet Take 20 mg by mouth daily. 06/09/15  Yes Historical Provider, MD  methadone (DOLOPHINE) 10 MG tablet Take 10 mg by mouth at bedtime. 06/16/15  Yes Historical Provider, MD  oxyCODONE (ROXICODONE) 15 MG immediate release tablet Take 15 mg by mouth 4 (four) times daily as needed for pain.  06/16/15  Yes Historical Provider, MD   Dg Chest Port 1 View  07/22/2015  CLINICAL DATA:  Central line placement. Ventilator dependent respiratory failure. EXAM: PORTABLE CHEST 1 VIEW 9:36 a.m. COMPARISON:  07/10/2015 at 7:08 a.m. FINDINGS: Left jugular vein catheter has been inserted. The tip extends into the superior vena cava and extends slightly cranially. Endotracheal tube and OG tube appear in good position. Extensive bilateral pulmonary infiltrates are again noted. No pneumothorax. IMPRESSION: Central line tip is in the superior vena cava but is directed cranially. Extensive bilateral pulmonary infiltrates, essentially unchanged. Electronically Signed   By: Lorriane Shire M.D.   On: 07/13/2015 09:47   Dg Chest Port 1 View  07/14/2015  CLINICAL DATA:  Ventilator dependent respiratory failure EXAM: PORTABLE CHEST 1 VIEW COMPARISON:  None. FINDINGS: Endotracheal tube tip is 3 cm above the carina. An orogastric tube is coiled in the fundus. There are EKG leads over the chest which create artifact. Diffuse airspace disease, asymmetrically dense around the left hilum. Possible layering pleural effusions.  No pneumothorax. Accounting for technique, heart size is borderline enlarged. IMPRESSION: 1. Unremarkable positioning of endotracheal and orogastric tubes. 2. Extensive, asymmetric airspace disease favoring pneumonia. Electronically Signed   By: Monte Fantasia M.D.   On: 08/08/2015 07:31   Dg Knee Left Port  08/06/2015  CLINICAL DATA:  Infection of left stump, below the knee amputation stump complication. EXAM: PORTABLE LEFT KNEE - 1-2 VIEW COMPARISON:  None. FINDINGS: Soft tissue defect overlies a below-the-knee  amputation. There is some cortical regularity along the tibial surgical margin but no prior exams for comparison. No joint effusion. Vascular calcifications. IMPRESSION: Soft tissue defect overlies the tibial surgical margin. Associated cortical haziness and irregularity, without comparison exam. Osteomyelitis cannot be excluded. Electronically Signed   By: Lorin Picket M.D.   On: 07/29/2015 08:15   US Abdomen Limited Ruq  07/11/2015  CLINICAL DATA:  Hepatitis-C, history of Whipple procedure EXAM: US ABDOMEN LIMITED - RIGHT UPPER QUADRANT COMPARISON:  None. FINDINGS: Gallbladder: Surgically absent Common bile duct: Diameter: 3.3 mm in diameter within normal limits. Liver: No focal lesion identified. Within normal limits in parenchymal echogenicity. Right pleural effusion is noted. There is small abdominal ascites. IMPRESSION: 1. Surgically absent gallbladder. Normal CBD. No focal hepatic mass. Right pleural effusion is noted. Small abdominal ascites. Electronically Signed   By: Lahoma Crocker M.D.   On: 07/24/2015 10:54    Positive ROS: All other systems have been reviewed and were otherwise negative with the exception of those mentioned in the HPI and as above.  Labs cbc  Recent Labs  07/30/2015 0902  WBC 2.8*  HGB 10.0*  HCT 28.3*  PLT 127*    Labs inflam No results for input(s): CRP in the last 72 hours.  Invalid input(s): ESR  Labs coag  Recent Labs  07/16/2015 0903  INR 1.24     Recent Labs  07/21/2015 0902  NA 142  K 2.8*  CL 107  CO2 30  GLUCOSE <20*  BUN 8  CREATININE 0.84  CALCIUM 7.2*    Physical Exam: Filed Vitals:   08/08/2015 1130 07/30/2015 1149  BP: 166/96 155/95  Pulse: 69 70  Resp: 14 14   General: intubated and unable to participate in exam Cardiovascular: No pedal edema Respiratory: intubated GI: No organomegaly, abdomen is soft and non-tender Skin: No lesions in the area of chief complaint other than those listed below in MSK exam.  Neurologic:  Sensation intact distally Psychiatric: intubated so not capable of consent  Lymphatic: No axillary or cervical lymphadenopathy  MUSCULOSKELETAL:  L BKA has an actively draining wound to the stump.  Wound tracked down to bone on probing.  Culture taken at bedside.  Unable to evaluate further due to intubation.  Other extremities are atraumatic with painless ROM and NVI.  Assessment: L BKA wound with active draining in the setting of elevated WBC, started on IV abx  Plan: Wound actively draining.  Culture taken at bedside. Results pending.  Imaging concerning for osteomyelitis.  Will continue with dry dressing and discuss the case with Dr. Sharol Given for further treatment planning. Will recommend continuing abx till culture results.   Gae Dry, PA-C Cell [phone]   07/14/2015 2:25 PM

## 2015-07-12 NOTE — Progress Notes (Signed)
Utilization Review Completed.Megen Madewell T1/10/2015  

## 2015-07-12 NOTE — Procedures (Signed)
Central Venous Catheter Insertion Procedure Note Victor CornfieldRobert Mcdonald [id number] 1961/01/16  Procedure: Insertion of Central Venous Catheter Indications: Assessment of intravascular volume, Drug and/or fluid administration and Frequent blood sampling  Procedure Details Consent: Unable to obtain consent because of emergent medical necessity. Time Out: Verified patient identification, verified procedure, site/side was marked, verified correct patient position, special equipment/implants available, medications/allergies/relevent history reviewed, required imaging and test results available.  Performed  Maximum sterile technique was used including antiseptics, cap, gloves, gown, hand hygiene, mask and sheet. Skin prep: Chlorhexidine; local anesthetic administered A antimicrobial bonded/coated triple lumen catheter was placed in the left internal jugular vein using the Seldinger technique. Ultrasound guidance used.Yes.   Catheter placed to 20 cm. Blood aspirated via all 3 ports and then flushed x 3. Line sutured x 2 and dressing applied.  Evaluation Blood flow good Complications: No apparent complications Patient did tolerate procedure well. Chest X-ray ordered to verify placement.  CXR: pending.  Victor CanalesSteve Mcdonald ACNP Victor PollackLe Mcdonald PCCM Pager [phone] till 3 pm If no answer page [phone] 07/31/2015, 9:07 AM

## 2015-07-12 NOTE — Progress Notes (Signed)
Initial Nutrition Assessment  DOCUMENTATION CODES:   Not applicable  INTERVENTION:    If TF started, recommend Vital High Protein -- initiate at 20 ml/hr and increase by 10 ml every 4 hours to goal rate of 50 ml/hr  TF regimen to provide 1200 kcals, 105 gm protein, 1003 ml of free water  NUTRITION DIAGNOSIS:   Inadequate oral intake related to inability to eat as evidenced by NPO status  GOAL:   Patient will meet greater than or equal to 90% of their needs  MONITOR:   Vent status, Labs, Weight trends, Skin, I & O's  REASON FOR ASSESSMENT:   Ventilator  ASSESSMENT:   55 yo Male was brought to ER. He was hypoxia. He required intubation. At time of intubation he was noted to have hematemesis. Reported that stool occult blood was negative. He was anemic and given PRBC transfusion. He was noted to be hypoglycemic. His CXR showed infiltrates. He had blood cx's done at outside hospital, and given vancomycin, zosyn. He had CT head that was negative. [facility]   CCM note reviewed.  Pt with sepsis from HCAP and possible L BKA stump infection.  Patient is currently intubated on ventilator support -- OGT in place MV: 7.3 L/min Temp (24hrs), Avg:94.1 F (34.5 C), Min:93.9 F (34.4 C), Max:94.3 F (34.6 C)   Propofol: 12.8 ml/hr ----> 338 fat kcals  RD unable to complete Nutrition Focused Physical Exam at this time, however, suspect malnutrition.  Diet Order:  Diet NPO time specified  Skin:  Wound (see comment) (L stump wound)  Last BM:  N/A  Height:   Ht Readings from Last 1 Encounters:  2015-09-24 5\' 9"  (1.753 m)    Weight:   Wt Readings from Last 1 Encounters:  2015-09-24 134 lb 12.8 oz (61.145 kg)    Ideal Body Weight:  73 kg  BMI:  Body mass index is 19.9 kg/(m^2).  Estimated Nutritional Needs:   Kcal:  1182  Protein:  95-105 gm  Fluid:  per MD  EDUCATION NEEDS:   No education needs identified at this time  Maureen ChattersKatie  Linnie Mcglocklin, RD, LDN Pager #: [phone] After-Hours Pager #: [phone]

## 2015-07-12 NOTE — Progress Notes (Addendum)
CRITICAL VALUE ALERT  Critical value received:  Lactic Acid 2.1  Date of notification:  07/30/2015  Time of notification:  1850  Critical value read back:Yes.    Nurse who received alert:  Jeremy Johannara Parker, RN  MD notified (1st page):  Dr. Jamison NeighborNestor  Time of first page:  1851  Responding MD:  Dr. Jamison NeighborNestor  Time MD responded:  1851  No new orders received. Will continue to monitor pt closely.

## 2015-07-12 NOTE — Consult Note (Addendum)
WOC consult requested for left stump wound. Area is fluctuant, according to the EMR.  X-ray indicates: Soft tissue defect overlies the tibial surgical margin. Associated cortical haziness and irregularity, without comparison exam. Osteomyelitis cannot be excluded.  This complex medical condition is beyond the scope of the WOC nurse.  Progress notes indicate that ortho consult is pending.  Moist gauze dressing ordered for bedside nurses until further input is available from ortho team.   Please re-consult if further assistance is needed.  Thank-you,  Cammie Mcgeeawn Reannon Candella MSN, RN, CWOCN, CarmiWCN-AP, CNS [phone]

## 2015-07-12 NOTE — Progress Notes (Signed)
RT NOTE:  [facility] Pt placed on our Vent. Settings: VT- 510, RR- 14, PEEP-5, FIO2- 40%. ETT 7.5 secured @ 22 ATL. Sats 100%. ABG drawn and MD entered orders to match settings.

## 2015-07-12 NOTE — Progress Notes (Addendum)
Hypoglycemic Event  CBG: <60  Treatment: D50 IV 50 mL  Symptoms: None  Follow-up CBG: Time:1000 CBG Result:80  Possible Reasons for Event: Unknown  Comments/MD notified:  MD at bedside.  D50 given after lab draw. CBG <10 ~ 0730. Needed lab draw to confirm. Delay in lab arriving. D50 given and sample drawn after central line placed.   Gloriajean DellParker, Jessika Rothery Gail

## 2015-07-13 ENCOUNTER — Inpatient Hospital Stay (HOSPITAL_COMMUNITY): Payer: Medicare Other

## 2015-07-13 DIAGNOSIS — J9690 Respiratory failure, unspecified, unspecified whether with hypoxia or hypercapnia: Secondary | ICD-10-CM | POA: Diagnosis present

## 2015-07-13 LAB — COMPREHENSIVE METABOLIC PANEL
ALK PHOS: 740 U/L — AB (ref 38–126)
ALT: 17 U/L (ref 17–63)
ANION GAP: 6 (ref 5–15)
AST: 39 U/L (ref 15–41)
BUN: 9 mg/dL (ref 6–20)
CALCIUM: 6.9 mg/dL — AB (ref 8.9–10.3)
CHLORIDE: 105 mmol/L (ref 101–111)
CO2: 25 mmol/L (ref 22–32)
Creatinine, Ser: 1.01 mg/dL (ref 0.61–1.24)
GFR calc non Af Amer: >60 mL/min (ref >60)
Glucose, Bld: 108 mg/dL — ABNORMAL HIGH (ref 65–99)
POTASSIUM: 4.4 mmol/L (ref 3.5–5.1)
SODIUM: 136 mmol/L (ref 135–145)
Total Bilirubin: 1 mg/dL (ref 0.3–1.2)
Total Protein: 5 g/dL — ABNORMAL LOW (ref 6.5–8.1)

## 2015-07-13 LAB — CBC
HEMATOCRIT: 18.8 % — AB (ref 39.0–52.0)
HEMOGLOBIN: 6.5 g/dL — AB (ref 13.0–17.0)
MCH: 28.8 pg (ref 26.0–34.0)
MCHC: 34.6 g/dL (ref 30.0–36.0)
MCV: 83.2 fL (ref 78.0–100.0)
Platelets: 156 10*3/uL (ref 150–400)
RBC: 2.26 MIL/uL — AB (ref 4.22–5.81)
RDW: 15.2 % (ref 11.5–15.5)
WBC: 15.3 10*3/uL — ABNORMAL HIGH (ref 4.0–10.5)

## 2015-07-13 LAB — BLOOD GAS, ARTERIAL
ACID-BASE EXCESS: 0.2 mmol/L (ref 0.0–2.0)
Bicarbonate: 23.7 mEq/L (ref 20.0–24.0)
DRAWN BY: 437071
FIO2: 0.4
O2 Saturation: 95.9 %
PCO2 ART: 33.4 mmHg — AB (ref 35.0–45.0)
PEEP/CPAP: 5 cmH2O
PH ART: 7.461 — AB (ref 7.350–7.450)
PO2 ART: 73.2 mmHg — AB (ref 80.0–100.0)
Patient temperature: 96.9
RATE: 14 resp/min
TCO2: 24.8 mmol/L (ref 0–100)
VT: 510 mL

## 2015-07-13 LAB — GLUCOSE, CAPILLARY
GLUCOSE-CAPILLARY: 115 mg/dL — AB (ref 65–99)
GLUCOSE-CAPILLARY: 141 mg/dL — AB (ref 65–99)
GLUCOSE-CAPILLARY: 179 mg/dL — AB (ref 65–99)
Glucose-Capillary: 110 mg/dL — ABNORMAL HIGH (ref 65–99)
Glucose-Capillary: 135 mg/dL — ABNORMAL HIGH (ref 65–99)
Glucose-Capillary: 150 mg/dL — ABNORMAL HIGH (ref 65–99)
Glucose-Capillary: 166 mg/dL — ABNORMAL HIGH (ref 65–99)
Glucose-Capillary: 98 mg/dL (ref 65–99)

## 2015-07-13 LAB — ABO/RH: ABO/RH(D): A POS

## 2015-07-13 LAB — PHOSPHORUS: PHOSPHORUS: 3.2 mg/dL (ref 2.5–4.6)

## 2015-07-13 LAB — PREPARE RBC (CROSSMATCH)

## 2015-07-13 LAB — MAGNESIUM: MAGNESIUM: 1.7 mg/dL (ref 1.7–2.4)

## 2015-07-13 LAB — PROCALCITONIN: PROCALCITONIN: 6.8 ng/mL

## 2015-07-13 LAB — TRIGLYCERIDES: Triglycerides: 53 mg/dL (ref <150)

## 2015-07-13 MED ORDER — SODIUM CHLORIDE 0.9 % IV SOLN
Freq: Once | INTRAVENOUS | Status: AC
  Administered 2015-07-13: 06:00:00 via INTRAVENOUS

## 2015-07-13 MED ORDER — FENTANYL NICU BOLUS VIA INFUSION: 25.0000 ug | INTRAVENOUS | Status: DC | PRN

## 2015-07-13 MED ORDER — FENTANYL BOLUS VIA INFUSION
25.0000 ug | INTRAVENOUS | Status: DC | PRN
  Administered 2015-07-18 – 2015-07-22 (×6): 50 ug via INTRAVENOUS
  Filled 2015-07-13 (×2): qty 50

## 2015-07-13 MED ORDER — VITAL HIGH PROTEIN PO LIQD
1000.0000 mL | ORAL | Status: DC
  Filled 2015-07-13 (×3): qty 1000

## 2015-07-13 MED ORDER — SODIUM CHLORIDE 0.9 % IV SOLN
25.0000 ug/h | INTRAVENOUS | Status: DC
  Administered 2015-07-13 – 2015-07-14 (×2): 150 ug/h via INTRAVENOUS
  Administered 2015-07-15: 200 ug/h via INTRAVENOUS
  Administered 2015-07-15: 150 ug/h via INTRAVENOUS
  Administered 2015-07-16: 200 ug/h via INTRAVENOUS
  Administered 2015-07-16: 150 ug/h via INTRAVENOUS
  Administered 2015-07-17: 100 ug/h via INTRAVENOUS
  Administered 2015-07-18 – 2015-07-19 (×2): 150 ug/h via INTRAVENOUS
  Administered 2015-07-22 (×2): 50 ug/h via INTRAVENOUS
  Filled 2015-07-13 (×11): qty 50

## 2015-07-13 MED ORDER — VANCOMYCIN HCL IN DEXTROSE 750-5 MG/150ML-% IV SOLN
750.0000 mg | Freq: Two times a day (BID) | INTRAVENOUS | Status: DC
  Administered 2015-07-13 – 2015-07-15 (×4): 750 mg via INTRAVENOUS
  Filled 2015-07-13 (×6): qty 150

## 2015-07-13 MED ORDER — FUROSEMIDE 10 MG/ML IJ SOLN
40.0000 mg | Freq: Once | INTRAMUSCULAR | Status: AC
  Administered 2015-07-13: 40 mg via INTRAVENOUS
  Filled 2015-07-13: qty 4

## 2015-07-13 MED ORDER — VITAL AF 1.2 CAL PO LIQD
1000.0000 mL | ORAL | Status: DC
  Administered 2015-07-13: 1000 mL
  Filled 2015-07-13 (×5): qty 1000

## 2015-07-13 MED ORDER — HYDRALAZINE HCL 20 MG/ML IJ SOLN
10.0000 mg | INTRAMUSCULAR | Status: DC | PRN
  Administered 2015-07-13 – 2015-07-23 (×9): 10 mg via INTRAVENOUS
  Filled 2015-07-13 (×10): qty 1

## 2015-07-13 NOTE — Progress Notes (Signed)
ANTIBIOTIC CONSULT NOTE   Pharmacy Consult for zosyn + vancomycin Indication: rule out pneumonia, possible BKA stump infxn  No Known Allergies  Patient Measurements: Height: 5\' 9"  (175.3 cm) Weight: 143 lb 11.8 oz (65.2 kg) IBW/kg (Calculated) : 70.7   Vital Signs: Temp: 98.6 F (37 C) (01/05 0800) BP: 154/82 mmHg (01/05 0800) Pulse Rate: 74 (01/05 0800) Intake/Output from previous day: 01/04 0701 - 01/05 0700 In: 3890.3 [I.V.:2512.8; NG/GT:90; IV Piggyback:1287.5] Out: 840 [Urine:640; Emesis/NG output:200] Intake/Output from this shift: Total I/O In: 438.2 [I.V.:378.2; Blood:30; NG/GT:30] Out: 80 [Urine:80]  Labs:  Recent Labs  08/08/2015 0902 07/15/2015 1620 07/11/2015 1735 07/23/2015 2055 07/13/15 0344  WBC 2.8*  --   --   --  15.3*  HGB 10.0* 9.7*  --  8.6* 6.5*  PLT 127*  --   --   --  156  CREATININE 0.84  --  0.99  --  1.01   Estimated Creatinine Clearance: 78 mL/min (by C-G formula based on Cr of 1.01). No results for input(s): VANCOTROUGH, VANCOPEAK, VANCORANDOM, GENTTROUGH, GENTPEAK, GENTRANDOM, TOBRATROUGH, TOBRAPEAK, TOBRARND, AMIKACINPEAK, AMIKACINTROU, AMIKACIN in the last 72 hours.   Microbiology: Recent Results (from the past 720 hour(s))  MRSA PCR Screening     Status: Abnormal   Collection Time: 07/24/2015  6:30 AM  Result Value Ref Range Status   MRSA by PCR POSITIVE (A) NEGATIVE Final    Comment:        The GeneXpert MRSA Assay (FDA approved for NASAL specimens only), is one component of a comprehensive MRSA colonization surveillance program. It is not intended to diagnose MRSA infection nor to guide or monitor treatment for MRSA infections. RESULT CALLED TO, READ BACK BY AND VERIFIED WITH: T PARKER,RN AT 9604 07/18/2015 BY L BENFIELD   Wound culture     Status: None (Preliminary result)   Collection Time: 07/13/2015  2:59 PM  Result Value Ref Range Status   Specimen Description WOUND  Final   Special Requests LEFT BELOW THE KNEE AMPUTATION  Final    Gram Stain   Final    ABUNDANT WBC PRESENT,BOTH PMN AND MONONUCLEAR NO SQUAMOUS EPITHELIAL CELLS SEEN NO ORGANISMS SEEN Performed at [facility]    Culture NO GROWTH Performed at [facility]   Final   Report Status PENDING  Incomplete   Medications:  Anti-infectives    Start     Dose/Rate Route Frequency Ordered Stop   07/13/15 1600  vancomycin (VANCOCIN) IVPB 750 mg/150 ml premix     750 mg 150 mL/hr over 60 Minutes Intravenous Every 12 hours 07/13/15 0842     07/30/2015 1100  piperacillin-tazobactam (ZOSYN) IVPB 3.375 g     3.375 g 12.5 mL/hr over 240 Minutes Intravenous Every 8 hours 07/13/2015 0959     07/14/2015 1100  vancomycin (VANCOCIN) IVPB 1000 mg/200 mL premix  Status:  Discontinued     1,000 mg 200 mL/hr over 60 Minutes Intravenous Every 8 hours 08/04/2015 0959 07/13/15 0841     Assessment: 54 yom transferred from OSH with respiratory failure and possible GIB. He continues on empiric vancomycin and zosyn. WBC is now elevated at 15.3 and Scr has trended up slightly. Dosing based on height and weight from OSH paperwork. New measured weight is smaller, so vancomycin will require adjustment.  Vanc 1/4>> Zosyn 1/4>>  1/4 MRSA - POS 1/4 Wound - NGTD  Goal of Therapy:  Vancomycin trough level 15-20 mcg/ml  Plan:  - Continue Zosyn 3.375gm IV Q8H (4 hr  inf) - Change vancomycin to 750mg  IV Q12H - F/u renal fxn, C&S, clinical status and [facility]  Darby Shadwick, Drake Leachachel Lynn 07/13/2015,8:48 AM

## 2015-07-13 NOTE — Progress Notes (Addendum)
Nutrition Consult/Follow Up  DOCUMENTATION CODES:   Not applicable  INTERVENTION:    D/C Vital High Protein formula   Initiate Vital AF 1.2 at 20 ml/hr and increase by 10 ml every 8 hours to goal rate of 60 ml/hr   Monitor Mg, Phos, K+ -- replete as needed  TF regimen to provide 1728 kcals, 108 gm protein, 1168 ml of free water  NUTRITION DIAGNOSIS:   Inadequate oral intake related to inability to eat as evidenced by NPO status, ongoing  GOAL:   Patient will meet greater than or equal to 90% of their needs, progressing  MONITOR:   TF tolerance, Vent status, Labs, Weight trends, I & O's  ASSESSMENT:   55 yo Male was brought to ER. He was hypoxic. He required intubation. Also noted to have hematemesis. Reported that stool occult blood was negative. He was anemic and given PRBC transfusion. He was noted to be hypoglycemic. His CXR showed infiltrates. He had blood cx's done at outside hospital, and given vancomycin, zosyn. He had CT head that was negative. [facility]  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  Patient is currently intubated on ventilator support -- OGT in place MV: 8.9 L/min Temp (24hrs), Avg:96.4 F (35.8 C), Min:93.9 F (34.4 C), Max:98.6 F (37 C)   Propofol: 3.7 ml/hr ----> 98 fat kcals  Vital High Protein formula ordered via Adult Tube Feeding Protocol.  Goal rate is 40 ml/hr.  RD suspects malnutrition.  Will increase TF slowly given some refeeding risk.  Diet Order:  Diet NPO time specified  Skin:  Wound (see comment) (L stump wound)  Last BM:  N/A  Height:   Ht Readings from Last 1 Encounters:  08/05/2015 5\' 9"  (1.753 m)    Weight:   Wt Readings from Last 1 Encounters:  07/13/15 143 lb 11.8 oz (65.2 kg)    Ideal Body Weight:  73 kg  BMI:  Body mass index is 21.22 kg/(m^2).  Estimated Nutritional Needs:   Kcal:  1670  Protein:  95-105 gm  Fluid:  per MD  EDUCATION  NEEDS:   No education needs identified at this time  Maureen ChattersKatie Delesia Martinek, RD, LDN Pager #: [phone] After-Hours Pager #: [phone]

## 2015-07-13 NOTE — Progress Notes (Signed)
PULMONARY / CRITICAL CARE MEDICINE   Name: Victor CornfieldRobert Mcdonald MRN: [id number] DOB: [birth year]    ADMISSION DATE:  07/14/2015  REFERRING MD:  Outside hospital ER  CHIEF COMPLAINT:  Can't breath  SUBJECTIVE:  Getting blood.  VITAL SIGNS: BP 154/82 mmHg  Pulse 74  Temp(Src) 98.6 F (37 C) (Core (Comment))  Resp 20  Ht 5\' 9"  (1.753 m)  Wt 143 lb 11.8 oz (65.2 kg)  BMI 21.22 kg/m2  SpO2 98%  VENTILATOR SETTINGS: Vent Mode:  [-] PRVC FiO2 (%):  [40 %] 40 % Set Rate:  [14 bmp] 14 bmp Vt Set:  [510 mL-5110 mL] 5110 mL PEEP:  [5 cmH20] 5 cmH20 Plateau Pressure:  [20 cmH20-27 cmH20] 22 cmH20  INTAKE / OUTPUT: I/O last 3 completed shifts: In: 3890.3 [I.V.:2512.8; NG/GT:90; IV Piggyback:1287.5] Out: 840 [Urine:640; Emesis/NG output:200]  PHYSICAL EXAMINATION: General: thin Neuro:  RASS -2 HEENT:  ETT/OG in place Cardiovascular:  Regular, no murmur Lungs:  B/l crackles, no wheeze Abdomen:  Soft, mid-line scar, non tender, decreased bowel sounds Musculoskeletal:  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 Skin: no rashes  LABS:  BMET  Recent Labs Lab October 18, 2015 0902 October 18, 2015 1735 07/13/15 0344  NA 142 138 136  K 2.8* 3.1* 4.4  CL 107 104 105  CO2 30 26 25   BUN 8 10 9   CREATININE 0.84 0.99 1.01  GLUCOSE <20* 135* 108*    Electrolytes  Recent Labs Lab October 18, 2015 0902 October 18, 2015 0903 October 18, 2015 1735 07/13/15 0344  CALCIUM 7.2*  --  7.0* 6.9*  MG  --  1.7  --  1.7  PHOS  --  3.1  --  3.2    CBC  Recent Labs Lab October 18, 2015 0902 October 18, 2015 1620 October 18, 2015 2055 07/13/15 0344  WBC 2.8*  --   --  15.3*  HGB 10.0* 9.7* 8.6* 6.5*  HCT 28.3* 27.2* 24.3* 18.8*  PLT 127*  --   --  156    Coag's  Recent Labs Lab October 18, 2015 0903  INR 1.24    Sepsis Markers  Recent Labs Lab October 18, 2015 0903 October 18, 2015 1735 07/13/15 0344  LATICACIDVEN  --  2.1*  --   PROCALCITON 1.75  --  6.80    ABG  Recent Labs Lab October 18, 2015 0639 07/13/15 0330  PHART 7.403  7.461*  PCO2ART 47.4* 33.4*  PO2ART 145.0* 73.2*    Liver Enzymes  Recent Labs Lab October 18, 2015 0903 07/13/15 0344  AST 41 39  ALT 19 17  ALKPHOS 919* 740*  BILITOT 1.0 1.0  ALBUMIN <1.0* <1.0*    Cardiac Enzymes No results for input(s): TROPONINI, PROBNP in the last 168 hours.  Glucose  Recent Labs Lab October 18, 2015 1737 October 18, 2015 2007 October 18, 2015 2259 October 18, 2015 2344 07/13/15 0309 07/13/15 0520  GLUCAP 128* 114* 110* 103* 98 115*    Imaging Dg Chest Port 1 View  07/13/2015  CLINICAL DATA:  Acute respiratory failure, pneumonia, hepatic cirrhosis, sepsis. EXAM: PORTABLE CHEST 1 VIEW COMPARISON:  Portable chest x-ray of July 12, 2015 FINDINGS: The lungs are adequately inflated inflated. Fluffy alveolar opacities persist bilaterally and are more conspicuous. The right hemidiaphragm is now partially obscured. The cardiac silhouette remains enlarged. The pulmonary vascularity is engorged and indistinct. The endotracheal tube tip lies 5.8 cm above the carina. The esophagogastric tube in proximal port lie in the gastric cardia. The left internal jugular venous catheter tip projects over the midportion of the SVC. IMPRESSION: Worsening of bilateral alveolar opacities consistent with pneumonia, CHF, or combination of the  two. The support tubes are in reasonable position. Electronically Signed   By: David  [place] M.D.   On: 07/13/2015 07:41   Dg Chest Port 1 View  07/22/2015  CLINICAL DATA:  Central line placement. Ventilator dependent respiratory failure. EXAM: PORTABLE CHEST 1 VIEW 9:36 a.m. COMPARISON:  08/01/2015 at 7:08 a.m. FINDINGS: Left jugular vein catheter has been inserted. The tip extends into the superior vena cava and extends slightly cranially. Endotracheal tube and OG tube appear in good position. Extensive bilateral pulmonary infiltrates are again noted. No pneumothorax. IMPRESSION: Central line tip is in the superior vena cava but is directed cranially. Extensive bilateral pulmonary  infiltrates, essentially unchanged. Electronically Signed   By: Francene Boyers M.D.   On: 07/25/2015 09:47   US Abdomen Limited Ruq  08/02/2015  CLINICAL DATA:  Hepatitis-C, history of Whipple procedure EXAM: US ABDOMEN LIMITED - RIGHT UPPER QUADRANT COMPARISON:  None. FINDINGS: Gallbladder: Surgically absent Common bile duct: Diameter: 3.3 mm in diameter within normal limits. Liver: No focal lesion identified. Within normal limits in parenchymal echogenicity. Right pleural effusion is noted. There is small abdominal ascites. IMPRESSION: 1. Surgically absent gallbladder. Normal CBD. No focal hepatic mass. Right pleural effusion is noted. Small abdominal ascites. Electronically Signed   By: Natasha Mead M.D.   On: 07/22/2015 10:54    STUDIES:  1/04 CT head >> negative 1/04 Abd u/s >> small ascites, normal CBG, no focal hepatic mass, absent GB  CULTURES: 1/04 Blood cx (OSH) >> 1/04 Sputum >>  ANTIBIOTICS: 1/04 Blood >> 1/04 Sputum >>  SIGNIFICANT EVENTS: 1/04 Transfer from OSH, GI and ortho consulted, started D10 infusion 1/05 Transfuse 1 unit PRBC  LINES/TUBES: 1/04 ETT >> 1/04 Lt IJ CVL >>   DISCUSSION: 55 yo male transferred from outside hospital with acute hypoxic respiratory failure, b/l pulmonary infiltrates, possible upper GI bleeding, acute encephalopathy, hypoglycemia, and possible Lt BKA stump infection.  ASSESSMENT / PLAN:  PULMONARY A: Acute hypoxic respiratory failure. P:   Pressure support wean as tolerated >> not ready for extubation F/u CXR PRN albuterol Lasix 40 mg IV x one on 1/05  CARDIOVASCULAR A:  Hx of HLD, HTN, chronic diastolic CHF. P:  PRN labetalol for SBP > 170  RENAL A:   Hypokalemia. P:   Monitor renal fx, urine outpt, electrolytes  GASTROINTESTINAL A:   ?Upper GI bleed. Hx of Hep C. Hx of pancreatitis. S/p Whipple procedure in 2011. Severe protein calorie malnutrition. P:   Tube feeds Protonix gtt F/u with GI to determine timing  of EGD  HEMATOLOGIC A:   Acute blood loss anemia from reported upper GI bleed. 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 P:   F/u procalcitonin Day 2 vancomycin, zosyn Ortho to f/u to assess whether he needs Lt AKA  ENDOCRINE A:   Hypoglycemia. Hx of DM type II. P:   D10 in IV fluids for now SSI  NEUROLOGIC A:   Acute metabolic encephalopathy. P:   RASS goal: -1  Family contact - Brother, Casimiro Needle >> [phone]  CC time 33 minutes.  Ok for lateral transfer to [facility] ICU.   Coralyn Helling, MD [facility] Pulmonary/Critical Care 07/13/2015, 8:37 AM Pager:  [phone] After 3pm call: [phone]

## 2015-07-13 NOTE — Consult Note (Signed)
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 Edmonia Lynch  Victor Mcdonald is an 55 y.o. male.  HPI: Patient is a 54 year old gentleman who is seen in the ICU. Patient is sedated and intubated. 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  Past Medical History  Diagnosis Date  . Hepatitis C   . Hypertension   . Hyperlipidemia   . CHF (congestive heart failure) ([place])   . Pancreatitis   . Diabetes mellitus type 2 in nonobese [facility])     Past Surgical History  Procedure Laterality Date  . Below knee leg amputation Left   . Whipple procedure  2011    No family history on file.  Social History:  has no tobacco, alcohol, and drug history on file.  Allergies: No Known Allergies  Medications: I have reviewed the patient's current medications.  Results for orders placed or performed during the hospital encounter of 07/30/2015 (from the past 48 hour(s))  MRSA PCR Screening     Status: Abnormal   Collection Time: 07/20/2015  6:30 AM  Result Value Ref Range   MRSA by PCR POSITIVE (A) NEGATIVE    Comment:        The GeneXpert MRSA Assay (FDA approved for NASAL specimens only), is one component of a comprehensive MRSA colonization surveillance program. It is not intended to diagnose MRSA infection nor to guide or monitor treatment for MRSA infections. RESULT CALLED TO, READ BACK BY AND VERIFIED WITH: Ramiro Harvest AT [id number] 07/14/2015 BY L BENFIELD   I-STAT 3, arterial blood gas (G3+)     Status: Abnormal   Collection Time: 08/05/2015  6:39 AM  Result Value Ref Range   pH, Arterial 7.403 7.350 - 7.450   pCO2 arterial 47.4 (H) 35.0 - 45.0 mmHg   pO2, Arterial 145.0 (H) 80.0 - 100.0 mmHg   Bicarbonate 29.7 (H) 20.0 - 24.0 mEq/L   TCO2 31 0 - 100 mmol/L   O2 Saturation 99.0 %   Acid-Base Excess 4.0 (H) 0.0 - 2.0 mmol/L   Patient temperature 97.5 F    Collection site RADIAL, ALLEN'S  TEST ACCEPTABLE    Drawn by RT    Sample type ARTERIAL   Glucose, capillary     Status: Abnormal   Collection Time: 07/11/2015  7:03 AM  Result Value Ref Range   Glucose-Capillary <10 (LL) 65 - 99 mg/dL   Comment 1 Capillary Specimen    Comment 2 Notify RN    Comment 3 Document in Chart   Glucose, capillary     Status: Abnormal   Collection Time: 07/11/2015  7:05 AM  Result Value Ref Range   Glucose-Capillary <10 (LL) 65 - 99 mg/dL   Comment 1 Capillary Specimen    Comment 2 Notify RN    Comment 3 Document in Chart   Urine rapid drug screen (hosp performed)     Status: Abnormal   Collection Time: 07/28/2015  7:35 AM  Result Value Ref Range   Opiates POSITIVE (A) NONE DETECTED   Cocaine NONE DETECTED NONE DETECTED   Benzodiazepines POSITIVE (A) NONE DETECTED   Amphetamines NONE DETECTED NONE DETECTED   Tetrahydrocannabinol NONE DETECTED NONE DETECTED   Barbiturates NONE DETECTED NONE DETECTED    Comment:        DRUG SCREEN FOR MEDICAL PURPOSES ONLY.  IF CONFIRMATION IS NEEDED FOR ANY PURPOSE, NOTIFY LAB WITHIN 5 DAYS.  LOWEST DETECTABLE LIMITS FOR URINE DRUG SCREEN Drug Class       Cutoff (ng/mL) Amphetamine      1000 Barbiturate      200 Benzodiazepine   469 Tricyclics       629 Opiates          300 Cocaine          300 THC              50   Basic metabolic panel     Status: Abnormal   Collection Time: 07/22/2015  9:02 AM  Result Value Ref Range   Sodium 142 135 - 145 mmol/L   Potassium 2.8 (L) 3.5 - 5.1 mmol/L   Chloride 107 101 - 111 mmol/L   CO2 30 22 - 32 mmol/L   Glucose, Bld <20 (LL) 65 - 99 mg/dL    Comment: CRITICAL RESULT CALLED TO, READ BACK BY AND VERIFIED WITH: PARKER,T RN @ [phone] 08/03/2015 LEONARD,A    BUN 8 6 - 20 mg/dL   Creatinine, Ser 0.84 0.61 - 1.24 mg/dL   Calcium 7.2 (L) 8.9 - 10.3 mg/dL   GFR calc non Af Amer >60 >60 mL/min   GFR calc Af Amer >60 >60 mL/min    Comment: (NOTE) The eGFR has been calculated using the CKD EPI equation. This  calculation has not been validated in all clinical situations. eGFR's persistently <60 mL/min signify possible Chronic Kidney Disease.    Anion gap 5 5 - 15  CBC     Status: Abnormal   Collection Time: 07/18/2015  9:02 AM  Result Value Ref Range   WBC 2.8 (L) 4.0 - 10.5 K/uL   RBC 3.43 (L) 4.22 - 5.81 MIL/uL   Hemoglobin 10.0 (L) 13.0 - 17.0 g/dL   HCT 28.3 (L) 39.0 - 52.0 %   MCV 82.5 78.0 - 100.0 fL   MCH 29.2 26.0 - 34.0 pg   MCHC 35.3 30.0 - 36.0 g/dL   RDW 14.6 11.5 - 15.5 %   Platelets 127 (L) 150 - 400 K/uL  Triglycerides     Status: None   Collection Time: 07/31/2015  9:03 AM  Result Value Ref Range   Triglycerides 128 <150 mg/dL  HIV antibody     Status: None   Collection Time: 07/26/2015  9:03 AM  Result Value Ref Range   HIV Screen 4th Generation wRfx Non Reactive Non Reactive    Comment: (NOTE) Performed At: [facility] [place], [place] [id number] Lindon Romp MD VO:[id number]   Procalcitonin - Baseline     Status: None   Collection Time: 07/16/2015  9:03 AM  Result Value Ref Range   Procalcitonin 1.75 ng/mL    Comment:        Interpretation: PCT > 0.5 ng/mL and <= 2 ng/mL: Systemic infection (sepsis) is possible, but other conditions are known to elevate PCT as well. (NOTE)         ICU PCT Algorithm               Non ICU PCT Algorithm    ----------------------------     ------------------------------         PCT < 0.25 ng/mL                 PCT < 0.1 ng/mL     Stopping of antibiotics            Stopping of antibiotics       strongly encouraged.  strongly encouraged.    ----------------------------     ------------------------------       PCT level decrease by               PCT < 0.25 ng/mL       >= 80% from peak PCT       OR PCT 0.25 - 0.5 ng/mL          Stopping of antibiotics                                             encouraged.     Stopping of antibiotics           encouraged.    ----------------------------      ------------------------------       PCT level decrease by              PCT >= 0.25 ng/mL       < 80% from peak PCT        AND PCT >= 0.5 ng/mL             Continuing antibiotics                                              encouraged.       Continuing antibiotics            encouraged.    ----------------------------     ------------------------------     PCT level increase compared          PCT > 0.5 ng/mL         with peak PCT AND          PCT >= 0.5 ng/mL             Escalation of antibiotics                                          strongly encouraged.      Escalation of antibiotics        strongly encouraged.   Hepatic function panel     Status: Abnormal   Collection Time: 07/21/2015  9:03 AM  Result Value Ref Range   Total Protein 5.5 (L) 6.5 - 8.1 g/dL   Albumin <1.0 (L) 3.5 - 5.0 g/dL   AST 41 15 - 41 U/L   ALT 19 17 - 63 U/L   Alkaline Phosphatase 919 (H) 38 - 126 U/L   Total Bilirubin 1.0 0.3 - 1.2 mg/dL   Bilirubin, Direct 0.5 0.1 - 0.5 mg/dL   Indirect Bilirubin 0.5 0.3 - 0.9 mg/dL  Protime-INR     Status: Abnormal   Collection Time: 07/25/2015  9:03 AM  Result Value Ref Range   Prothrombin Time 15.8 (H) 11.6 - 15.2 seconds   INR 1.24 0.00 - 1.49  Phosphorus     Status: None   Collection Time: 07/11/2015  9:03 AM  Result Value Ref Range   Phosphorus 3.1 2.5 - 4.6 mg/dL  Magnesium     Status: None   Collection Time: 07/11/2015  9:03 AM  Result Value Ref Range   Magnesium 1.7 1.7 - 2.4 mg/dL  Glucose, capillary     Status: None   Collection Time: 07/17/2015 10:00 AM  Result Value Ref Range   Glucose-Capillary 80 65 - 99 mg/dL  Glucose, capillary     Status: Abnormal   Collection Time: 07/24/2015 12:29 PM  Result Value Ref Range   Glucose-Capillary 54 (L) 65 - 99 mg/dL   Comment 1 Arterial Specimen   Glucose, capillary     Status: Abnormal   Collection Time: 08/03/2015  2:12 PM  Result Value Ref Range   Glucose-Capillary 154 (H) 65 - 99 mg/dL   Comment 1 Venous  Specimen   Glucose, capillary     Status: None   Collection Time: 07/15/2015  3:50 PM  Result Value Ref Range   Glucose-Capillary 89 65 - 99 mg/dL   Comment 1 Venous Specimen   Hemoglobin and hematocrit, blood     Status: Abnormal   Collection Time: 07/11/2015  4:20 PM  Result Value Ref Range   Hemoglobin 9.7 (L) 13.0 - 17.0 g/dL   HCT 27.2 (L) 39.0 - 33.2 %  Basic metabolic panel     Status: Abnormal   Collection Time: 08/06/2015  5:35 PM  Result Value Ref Range   Sodium 138 135 - 145 mmol/L   Potassium 3.1 (L) 3.5 - 5.1 mmol/L   Chloride 104 101 - 111 mmol/L   CO2 26 22 - 32 mmol/L   Glucose, Bld 135 (H) 65 - 99 mg/dL   BUN 10 6 - 20 mg/dL   Creatinine, Ser 0.99 0.61 - 1.24 mg/dL   Calcium 7.0 (L) 8.9 - 10.3 mg/dL   GFR calc non Af Amer >60 >60 mL/min   GFR calc Af Amer >60 >60 mL/min    Comment: (NOTE) The eGFR has been calculated using the CKD EPI equation. This calculation has not been validated in all clinical situations. eGFR's persistently <60 mL/min signify possible Chronic Kidney Disease.    Anion gap 8 5 - 15  Lactic acid, plasma     Status: Abnormal   Collection Time: 07/30/2015  5:35 PM  Result Value Ref Range   Lactic Acid, Venous 2.1 (HH) 0.5 - 2.0 mmol/L    Comment: CRITICAL RESULT CALLED TO, READ BACK BY AND VERIFIED WITH: C PARKER,RN [id number] 07/21/2015 WBOND   Glucose, capillary     Status: Abnormal   Collection Time: 08/05/2015  5:37 PM  Result Value Ref Range   Glucose-Capillary 128 (H) 65 - 99 mg/dL   Comment 1 Venous Specimen   Glucose, capillary     Status: Abnormal   Collection Time: 07/14/2015  8:07 PM  Result Value Ref Range   Glucose-Capillary 114 (H) 65 - 99 mg/dL  Hemoglobin and hematocrit, blood     Status: Abnormal   Collection Time: 07/11/2015  8:55 PM  Result Value Ref Range   Hemoglobin 8.6 (L) 13.0 - 17.0 g/dL   HCT 24.3 (L) 39.0 - 52.0 %  Glucose, capillary     Status: Abnormal   Collection Time: 08/01/2015 10:59 PM  Result Value Ref Range    Glucose-Capillary 110 (H) 65 - 99 mg/dL  Glucose, capillary     Status: Abnormal   Collection Time: 07/22/2015 11:44 PM  Result Value Ref Range   Glucose-Capillary 103 (H) 65 - 99 mg/dL   Comment 1 Capillary Specimen    Comment 2 Notify RN    Comment 3 Document in Chart   Glucose, capillary     Status: None   Collection Time: 07/13/15  3:09 AM  Result Value Ref Range   Glucose-Capillary 98 65 - 99 mg/dL  Blood gas, arterial     Status: Abnormal   Collection Time: 07/13/15  3:30 AM  Result Value Ref Range   FIO2 0.40    Delivery systems VENTILATOR    Mode PRESSURE REGULATED VOLUME CONTROL    VT 510 mL   LHR 14 resp/min   Peep/cpap 5.0 cm H20   pH, Arterial 7.461 (H) 7.350 - 7.450   pCO2 arterial 33.4 (L) 35.0 - 45.0 mmHg   pO2, Arterial 73.2 (L) 80.0 - 100.0 mmHg   Bicarbonate 23.7 20.0 - 24.0 mEq/L   TCO2 24.8 0 - 100 mmol/L   Acid-Base Excess 0.2 0.0 - 2.0 mmol/L   O2 Saturation 95.9 %   Patient temperature 96.9    Collection site LEFT RADIAL    Drawn by [id number]    Sample type ARTERIAL DRAW   Procalcitonin     Status: None   Collection Time: 07/13/15  3:44 AM  Result Value Ref Range   Procalcitonin 6.80 ng/mL    Comment:        Interpretation: PCT > 2 ng/mL: Systemic infection (sepsis) is likely, unless other causes are known. (NOTE)         ICU PCT Algorithm               Non ICU PCT Algorithm    ----------------------------     ------------------------------         PCT < 0.25 ng/mL                 PCT < 0.1 ng/mL     Stopping of antibiotics            Stopping of antibiotics       strongly encouraged.               strongly encouraged.    ----------------------------     ------------------------------       PCT level decrease by               PCT < 0.25 ng/mL       >= 80% from peak PCT       OR PCT 0.25 - 0.5 ng/mL          Stopping of antibiotics                                             encouraged.     Stopping of antibiotics           encouraged.     ----------------------------     ------------------------------       PCT level decrease by              PCT >= 0.25 ng/mL       < 80% from peak PCT        AND PCT >= 0.5 ng/mL            Continuing antibiotics                                               encouraged.       Continuing antibiotics  encouraged.    ----------------------------     ------------------------------     PCT level increase compared          PCT > 0.5 ng/mL         with peak PCT AND          PCT >= 0.5 ng/mL             Escalation of antibiotics                                          strongly encouraged.      Escalation of antibiotics        strongly encouraged.   Comprehensive metabolic panel     Status: Abnormal   Collection Time: 07/13/15  3:44 AM  Result Value Ref Range   Sodium 136 135 - 145 mmol/L   Potassium 4.4 3.5 - 5.1 mmol/L    Comment: DELTA CHECK NOTED   Chloride 105 101 - 111 mmol/L   CO2 25 22 - 32 mmol/L   Glucose, Bld 108 (H) 65 - 99 mg/dL   BUN 9 6 - 20 mg/dL   Creatinine, Ser 1.01 0.61 - 1.24 mg/dL   Calcium 6.9 (L) 8.9 - 10.3 mg/dL   Total Protein 5.0 (L) 6.5 - 8.1 g/dL   Albumin <1.0 (L) 3.5 - 5.0 g/dL   AST 39 15 - 41 U/L   ALT 17 17 - 63 U/L   Alkaline Phosphatase 740 (H) 38 - 126 U/L   Total Bilirubin 1.0 0.3 - 1.2 mg/dL   GFR calc non Af Amer >60 >60 mL/min   GFR calc Af Amer >60 >60 mL/min    Comment: (NOTE) The eGFR has been calculated using the CKD EPI equation. This calculation has not been validated in all clinical situations. eGFR's persistently <60 mL/min signify possible Chronic Kidney Disease.    Anion gap 6 5 - 15  CBC     Status: Abnormal   Collection Time: 07/13/15  3:44 AM  Result Value Ref Range   WBC 15.3 (H) 4.0 - 10.5 K/uL   RBC 2.26 (L) 4.22 - 5.81 MIL/uL   Hemoglobin 6.5 (LL) 13.0 - 17.0 g/dL    Comment: REPEATED TO VERIFY CRITICAL RESULT CALLED TO, READ BACK BY AND VERIFIED WITH: W COUNCIL,RN 0402 07/13/15 D BRADLEY    HCT 18.8 (L) 39.0  - 52.0 %   MCV 83.2 78.0 - 100.0 fL   MCH 28.8 26.0 - 34.0 pg   MCHC 34.6 30.0 - 36.0 g/dL   RDW 15.2 11.5 - 15.5 %   Platelets 156 150 - 400 K/uL  Magnesium     Status: None   Collection Time: 07/13/15  3:44 AM  Result Value Ref Range   Magnesium 1.7 1.7 - 2.4 mg/dL  Phosphorus     Status: None   Collection Time: 07/13/15  3:44 AM  Result Value Ref Range   Phosphorus 3.2 2.5 - 4.6 mg/dL  Triglycerides     Status: None   Collection Time: 07/13/15  3:44 AM  Result Value Ref Range   Triglycerides 53 <150 mg/dL  ABO/Rh     Status: None (Preliminary result)   Collection Time: 07/13/15  4:19 AM  Result Value Ref Range   ABO/RH(D) A POS   Type and screen [place]     Status: None (Preliminary result)  Collection Time: 07/13/15  5:11 AM  Result Value Ref Range   ABO/RH(D) A POS    Antibody Screen NEG    Sample Expiration 07/16/2015    Unit Number [id number]    Blood Component Type RED CELLS,LR    Unit division 00    Status of Unit ISSUED    Transfusion Status OK TO TRANSFUSE    Crossmatch Result Compatible   Prepare RBC     Status: None   Collection Time: 07/13/15  5:11 AM  Result Value Ref Range   Order Confirmation ORDER PROCESSED BY BLOOD BANK   Glucose, capillary     Status: Abnormal   Collection Time: 07/13/15  5:20 AM  Result Value Ref Range   Glucose-Capillary 115 (H) 65 - 99 mg/dL    Dg Chest Port 1 View  07/30/2015  CLINICAL DATA:  Central line placement. Ventilator dependent respiratory failure. EXAM: PORTABLE CHEST 1 VIEW 9:36 a.m. COMPARISON:  07/16/2015 at 7:08 a.m. FINDINGS: Left jugular vein catheter has been inserted. The tip extends into the superior vena cava and extends slightly cranially. Endotracheal tube and OG tube appear in good position. Extensive bilateral pulmonary infiltrates are again noted. No pneumothorax. IMPRESSION: Central line tip is in the superior vena cava but is directed cranially. Extensive bilateral pulmonary  infiltrates, essentially unchanged. Electronically Signed   By: Lorriane Shire M.D.   On: 07/19/2015 09:47   Dg Chest Port 1 View  07/26/2015  CLINICAL DATA:  Ventilator dependent respiratory failure EXAM: PORTABLE CHEST 1 VIEW COMPARISON:  None. FINDINGS: Endotracheal tube tip is 3 cm above the carina. An orogastric tube is coiled in the fundus. There are EKG leads over the chest which create artifact. Diffuse airspace disease, asymmetrically dense around the left hilum. Possible layering pleural effusions. No pneumothorax. Accounting for technique, heart size is borderline enlarged. IMPRESSION: 1. Unremarkable positioning of endotracheal and orogastric tubes. 2. Extensive, asymmetric airspace disease favoring pneumonia. Electronically Signed   By: Monte Fantasia M.D.   On: 07/09/2015 07:31   Dg Knee Left Port  07/25/2015  CLINICAL DATA:  Infection of left stump, below the knee amputation stump complication. EXAM: PORTABLE LEFT KNEE - 1-2 VIEW COMPARISON:  None. FINDINGS: Soft tissue defect overlies a below-the-knee amputation. There is some cortical regularity along the tibial surgical margin but no prior exams for comparison. No joint effusion. Vascular calcifications. IMPRESSION: Soft tissue defect overlies the tibial surgical margin. Associated cortical haziness and irregularity, without comparison exam. Osteomyelitis cannot be excluded. Electronically Signed   By: Lorin Picket M.D.   On: 08/08/2015 08:15   US Abdomen Limited Ruq  07/22/2015  CLINICAL DATA:  Hepatitis-C, history of Whipple procedure EXAM: US ABDOMEN LIMITED - RIGHT UPPER QUADRANT COMPARISON:  None. FINDINGS: Gallbladder: Surgically absent Common bile duct: Diameter: 3.3 mm in diameter within normal limits. Liver: No focal lesion identified. Within normal limits in parenchymal echogenicity. Right pleural effusion is noted. There is small abdominal ascites. IMPRESSION: 1. Surgically absent gallbladder. Normal CBD. No focal hepatic mass.  Right pleural effusion is noted. Small abdominal ascites. Electronically Signed   By: Lahoma Crocker M.D.   On: 07/22/2015 10:54    Review of Systems  All other systems reviewed and are negative.  Blood pressure 149/81, pulse 73, temperature 98.1 F (36.7 C), temperature source Core (Comment), resp. rate 19, height 5' 9"  (1.753 m), weight 65.2 kg (143 lb 11.8 oz), SpO2 98 %. Physical Exam On examination patient is a very short residual limb on the  left he has a chronic indurated ulcer which probes down to bone of the left below the knee amputation. Review the radiographs shows chronic changes of the bone consistent with osteomyelitis. Assessment/Plan: Assessment: Osteomyelitis left below the knee amputation with chronic ulceration.  Plan: Patient does not have sufficient soft tissue or bone for revision of the below the knee amputation. Patient would require an above-the-knee amputation this would require additional energy expenditure for ambulation and would require a new prosthesis. This is not urgent and I would need to discuss this with the patient once he is out of the ICU. I will follow-up as needed.  Sudie Bandel V 07/13/2015, 7:14 AM

## 2015-07-13 NOTE — Progress Notes (Signed)
eLink Physician-Brief Progress Note Patient Name: Victor CornfieldRobert Mcdonald DOB: 28-Jul-1960 MRN: [id number]   Date of Service  07/13/2015  HPI/Events of Note  RN notified of still uncontrolled BP despite IV Labetalol. Currently on Propofol gtt & Fentanyl IV prn.  eICU Interventions  1. Add Hydralazine IV prn 2. Switch to fentanyl gtt & bolus IV     Intervention Category Intermediate Interventions: Hypertension - evaluation and management  Lawanda CousinsJennings Ashleah Valtierra 07/13/2015, 5:21 PM

## 2015-07-13 NOTE — Progress Notes (Signed)
CRITICAL VALUE ALERT  Critical value received:  hgb 6.5  Date of notification:  07/13/15  Time of notification:  0400  Critical value read back:Yes.    Nurse who received alert:  Sharlet SalinaWilliam Donavin Audino   elink MD notified :  [phone]

## 2015-07-13 NOTE — Progress Notes (Signed)
eLink Physician-Brief Progress Note Patient Name: Victor CornfieldRobert Suitt DOB: [birth year] MRN: [id number]   Date of Service  07/13/2015  HPI/Events of Note  Hb=6.5  eICU Interventions  Ordered transfusion of 1 unit with post transfusion Hb.      Intervention Category Intermediate Interventions: Bleeding - evaluation and treatment with blood products  Broedy Osbourne 07/13/2015, 4:14 AM

## 2015-07-14 ENCOUNTER — Inpatient Hospital Stay (HOSPITAL_COMMUNITY): Payer: Medicare Other

## 2015-07-14 DIAGNOSIS — I509 Heart failure, unspecified: Secondary | ICD-10-CM

## 2015-07-14 LAB — BASIC METABOLIC PANEL
Anion gap: 5 (ref 5–15)
BUN: 12 mg/dL (ref 6–20)
CHLORIDE: 105 mmol/L (ref 101–111)
CO2: 26 mmol/L (ref 22–32)
Calcium: 7 mg/dL — ABNORMAL LOW (ref 8.9–10.3)
Creatinine, Ser: 1.39 mg/dL — ABNORMAL HIGH (ref 0.61–1.24)
GFR calc Af Amer: >60 mL/min (ref >60)
GFR calc non Af Amer: 56 mL/min — ABNORMAL LOW (ref >60)
GLUCOSE: 80 mg/dL (ref 65–99)
POTASSIUM: 3.6 mmol/L (ref 3.5–5.1)
Sodium: 136 mmol/L (ref 135–145)

## 2015-07-14 LAB — TYPE AND SCREEN
ABO/RH(D): A POS
ANTIBODY SCREEN: NEGATIVE
Unit division: 0

## 2015-07-14 LAB — CULTURE, RESPIRATORY W GRAM STAIN

## 2015-07-14 LAB — GLUCOSE, CAPILLARY
GLUCOSE-CAPILLARY: 92 mg/dL (ref 65–99)
GLUCOSE-CAPILLARY: 64 mg/dL — AB (ref 65–99)
GLUCOSE-CAPILLARY: 105 mg/dL — AB (ref 65–99)
Glucose-Capillary: 55 mg/dL — ABNORMAL LOW (ref 65–99)
Glucose-Capillary: 91 mg/dL (ref 65–99)
Glucose-Capillary: 61 mg/dL — ABNORMAL LOW (ref 65–99)

## 2015-07-14 LAB — CBC
HCT: 26.8 % — ABNORMAL LOW (ref 39.0–52.0)
HEMOGLOBIN: 9.3 g/dL — AB (ref 13.0–17.0)
MCH: 29.1 pg (ref 26.0–34.0)
MCHC: 34.7 g/dL (ref 30.0–36.0)
MCV: 83.8 fL (ref 78.0–100.0)
Platelets: 116 10*3/uL — ABNORMAL LOW (ref 150–400)
RBC: 3.2 MIL/uL — AB (ref 4.22–5.81)
RDW: 15.3 % (ref 11.5–15.5)
WBC: 15 10*3/uL — AB (ref 4.0–10.5)

## 2015-07-14 LAB — MAGNESIUM: Magnesium: 1.6 mg/dL — ABNORMAL LOW (ref 1.7–2.4)

## 2015-07-14 LAB — PROCALCITONIN: Procalcitonin: 3.74 ng/mL

## 2015-07-14 LAB — CULTURE, RESPIRATORY

## 2015-07-14 LAB — TRIGLYCERIDES: TRIGLYCERIDES: 73 mg/dL (ref <150)

## 2015-07-14 LAB — PHOSPHORUS: Phosphorus: 3.6 mg/dL (ref 2.5–4.6)

## 2015-07-14 MED ORDER — DEXTROSE 10 % IV SOLN
INTRAVENOUS | Status: DC
  Administered 2015-07-16: 40 mL/h via INTRAVENOUS

## 2015-07-14 MED ORDER — INSULIN ASPART 100 UNIT/ML ~~LOC~~ SOLN
0.0000 [IU] | SUBCUTANEOUS | Status: DC
  Administered 2015-07-15: 2 [IU] via SUBCUTANEOUS
  Administered 2015-07-15: 4 [IU] via SUBCUTANEOUS
  Administered 2015-07-15 – 2015-07-17 (×3): 1 [IU] via SUBCUTANEOUS
  Administered 2015-07-21 (×2): 2 [IU] via SUBCUTANEOUS

## 2015-07-14 MED ORDER — POTASSIUM CHLORIDE 20 MEQ/15ML (10%) PO SOLN
20.0000 meq | ORAL | Status: AC
  Administered 2015-07-14 (×2): 20 meq
  Filled 2015-07-14 (×2): qty 15

## 2015-07-14 MED ORDER — VITAMIN B-1 100 MG PO TABS
100.0000 mg | ORAL_TABLET | Freq: Every day | ORAL | Status: DC
  Administered 2015-07-14 – 2015-07-20 (×6): 100 mg
  Filled 2015-07-14 (×7): qty 1

## 2015-07-14 MED ORDER — MAGNESIUM SULFATE 2 GM/50ML IV SOLN
2.0000 g | Freq: Once | INTRAVENOUS | Status: AC
  Administered 2015-07-14: 2 g via INTRAVENOUS
  Filled 2015-07-14: qty 50

## 2015-07-14 MED ORDER — FUROSEMIDE 10 MG/ML IJ SOLN
40.0000 mg | Freq: Once | INTRAMUSCULAR | Status: AC
  Administered 2015-07-14: 40 mg via INTRAVENOUS
  Filled 2015-07-14: qty 4

## 2015-07-14 MED ORDER — FOLIC ACID 1 MG PO TABS
1.0000 mg | ORAL_TABLET | Freq: Every day | ORAL | Status: DC
  Administered 2015-07-14 – 2015-07-20 (×6): 1 mg
  Filled 2015-07-14 (×7): qty 1

## 2015-07-14 MED ORDER — DEXTROSE 50 % IV SOLN
INTRAVENOUS | Status: AC
  Administered 2015-07-14: 25 mL
  Filled 2015-07-14: qty 50

## 2015-07-14 NOTE — Progress Notes (Signed)
PULMONARY / CRITICAL CARE MEDICINE   Name: Victor CornfieldRobert Mcdonald MRN: [id number] DOB: Mar 01, 1961    ADMISSION DATE:  07/17/2015  REFERRING MD:  Outside hospital ER  CHIEF COMPLAINT:  Can't breath  SUBJECTIVE:  Hypoglycemic overnight.  VITAL SIGNS: BP 112/70 mmHg  Pulse 72  Temp(Src) 99.3 F (37.4 C) (Core (Comment))  Resp 18  Ht 5\' 9"  (1.753 m)  Wt 142 lb 13.7 oz (64.8 kg)  BMI 21.09 kg/m2  SpO2 94%  VENTILATOR SETTINGS: Vent Mode:  [-] PRVC FiO2 (%):  [40 %-55 %] 55 % Set Rate:  [14 bmp] 14 bmp Vt Set:  [510 mL] 510 mL PEEP:  [5 cmH20] 5 cmH20 Pressure Support:  [10 cmH20] 10 cmH20 Plateau Pressure:  [21 cmH20-22 cmH20] 22 cmH20  INTAKE / OUTPUT: I/O last 3 completed shifts: In: 5806.1 [I.V.:3841.1; Blood:392.5; NG/GT:660; IV Piggyback:912.5] Out: 1935 [Urine:1735; Emesis/NG output:200]  PHYSICAL EXAMINATION: General: thin Neuro:  RASS -2 HEENT:  ETT/OG in place Cardiovascular:  Regular, no murmur Lungs:  B/l crackles, no wheeze Abdomen:  Soft, mid-line scar, non tender, + bowel sounds Musculoskeletal:  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 Skin: no rashes  LABS:  BMET  Recent Labs Lab 07/14/2015 1735 07/13/15 0344 07/14/15 0430  NA 138 136 136  K 3.1* 4.4 3.6  CL 104 105 105  CO2 26 25 26   BUN 10 9 12   CREATININE 0.99 1.01 1.39*  GLUCOSE 135* 108* 80    Electrolytes  Recent Labs Lab 07/20/2015 0903 07/20/2015 1735 07/13/15 0344 07/14/15 0430  CALCIUM  --  7.0* 6.9* 7.0*  MG 1.7  --  1.7 1.6*  PHOS 3.1  --  3.2 3.6    CBC  Recent Labs Lab 08/06/2015 0902  07/30/2015 2055 07/13/15 0344 07/14/15 0430  WBC 2.8*  --   --  15.3* 15.0*  HGB 10.0*  < > 8.6* 6.5* 9.3*  HCT 28.3*  < > 24.3* 18.8* 26.8*  PLT 127*  --   --  156 116*  < > = values in this interval not displayed.  Coag's  Recent Labs Lab 07/23/2015 0903  INR 1.24    Sepsis Markers  Recent Labs Lab 07/13/2015 0903 07/18/2015 1735 07/13/15 0344 07/14/15 0430   LATICACIDVEN  --  2.1*  --   --   PROCALCITON 1.75  --  6.80 3.74    ABG  Recent Labs Lab 07/30/2015 0639 07/13/15 0330  PHART 7.403 7.461*  PCO2ART 47.4* 33.4*  PO2ART 145.0* 73.2*    Liver Enzymes  Recent Labs Lab 08/03/2015 0903 07/13/15 0344  AST 41 39  ALT 19 17  ALKPHOS 919* 740*  BILITOT 1.0 1.0  ALBUMIN <1.0* <1.0*    Cardiac Enzymes No results for input(s): TROPONINI, PROBNP in the last 168 hours.  Glucose  Recent Labs Lab 07/13/15 0520 07/13/15 0823 07/13/15 1239 07/13/15 1602 07/13/15 1929 07/13/15 2347  GLUCAP 115* 150* 141* 166* 179* 135*    Imaging Dg Chest Port 1 View  07/14/2015  CLINICAL DATA:  Respiratory failure . EXAM: PORTABLE CHEST 1 VIEW COMPARISON:  07/13/2015 . FINDINGS: Endotracheal tube, left IJ line, NG tube in good anatomic position. Cardiomegaly with diffuse bilateral pulmonary alveolar infiltrates again noted consistent with pulmonary edema. Bilateral pneumonia cannot be excluded. No prominent pleural effusion or pneumothorax IMPRESSION: 1. Lines and tubes in stable position . 2. Cardiomegaly with diffuse persistent bilateral pulmonary alveolar infiltrates consistent with pulmonary edema. No interim change. Bilateral pneumonia cannot be excluded.  Electronically Signed   By: Maisie Fus  Register   On: 07/14/2015 08:01    STUDIES:  1/04 CT head >> negative 1/04 Abd u/s >> small ascites, normal CBG, no focal hepatic mass, absent GB  CULTURES: 1/04 Blood cx (OSH) >> 1/04 Sputum >> 1/04 Lt stump wound  ANTIBIOTICS: 1/04 Vancomycin >> 1/04 Zosyn >>  SIGNIFICANT EVENTS: 1/04 Transfer from OSH, GI and ortho consulted, started D10 infusion 1/05 Transfuse 1 unit PRBC 1/06 Hypoglycemia  LINES/TUBES: 1/04 ETT >> 1/04 Lt IJ CVL >>   DISCUSSION: 55 yo male transferred from outside hospital with acute hypoxic respiratory failure, b/l pulmonary infiltrates, possible upper GI bleeding, acute encephalopathy, hypoglycemia, and possible Lt  BKA stump infection.  ASSESSMENT / PLAN:  PULMONARY A: Acute hypoxic respiratory failure. P:   Pressure support wean as tolerated >> not ready for extubation F/u CXR PRN albuterol Lasix 40 mg IV x one on 1/06  CARDIOVASCULAR A:  Hx of HLD, HTN, chronic diastolic CHF. P:  PRN labetalol, hydralazine for SBP > 170 Check Echo  RENAL A:   Hypokalemia. Hypomagnesemia. P:   Monitor renal fx, urine outpt, electrolytes  GASTROINTESTINAL A:   ?Upper GI bleed. Hx of Hep C. Hx of pancreatitis. S/p Whipple procedure in 2011. Severe protein calorie malnutrition. P:   Tube feeds Protonix  F/u with GI to determine timing of EGD  HEMATOLOGIC A:   Acute blood loss anemia from reported upper GI bleed. 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 P:   Day 3 vancomycin, zosyn Ortho to f/u to assess whether he needs Lt AKA >> deferring intervention for now  ENDOCRINE A:   Hypoglycemia. Hx of DM type II. P:   D10 in IV fluids at 40 ml/hr SSI  NEUROLOGIC A:   Acute metabolic encephalopathy. P:   RASS goal: -1  Family contact - Brother, Victor Needle >> [phone]  CC time 32 minutes.    Coralyn Helling, MD [facility] Pulmonary/Critical Care 07/14/2015, 8:37 AM Pager:  [phone] After 3pm call: [phone]

## 2015-07-14 NOTE — Progress Notes (Signed)
Echocardiogram 2D Echocardiogram has been performed.  Victor Mcdonald, Victor Mcdonald 07/14/2015, 4:02 PM

## 2015-07-14 NOTE — Progress Notes (Signed)
[facility]OC[facility] FOR AM LAB REPLACEMENT ONLY  The patient does apply for the [facility] Electrolyte [facility]plac[facility] Protocol based on the criteria listed below:   1. Is GFR >/= 40 ml/min? Yes.    Patient's GFR today is 56 2. Is urine output >/= 0.5 ml/kg/hr for the last 6 hours? Yes.   Patient's UOP is 1.0 ml/kg/hr 3. Is BUN < 60 mg/dL? Yes.    Patient's BUN today is 12 4. Abnormal electrolyte(s): K3.6 5. Ordered repletion with: per protocol 6. If a panic level lab has been reported, has the CCM MD in charge been notified? Yes.  .   Physician:  Victor Masker Byrum, MD  [place] Mcdonald, Victor Almeda William 07/14/2015 6:18 AM

## 2015-07-15 ENCOUNTER — Inpatient Hospital Stay (HOSPITAL_COMMUNITY): Payer: Medicare Other

## 2015-07-15 DIAGNOSIS — J80 Acute respiratory distress syndrome: Secondary | ICD-10-CM

## 2015-07-15 LAB — BASIC METABOLIC PANEL
ANION GAP: 5 (ref 5–15)
BUN: 14 mg/dL (ref 6–20)
CHLORIDE: 105 mmol/L (ref 101–111)
CO2: 25 mmol/L (ref 22–32)
Calcium: 7.2 mg/dL — ABNORMAL LOW (ref 8.9–10.3)
Creatinine, Ser: 1.62 mg/dL — ABNORMAL HIGH (ref 0.61–1.24)
GFR calc Af Amer: 54 mL/min — ABNORMAL LOW (ref >60)
GFR, EST NON AFRICAN AMERICAN: 47 mL/min — AB (ref >60)
GLUCOSE: 98 mg/dL (ref 65–99)
POTASSIUM: 3.6 mmol/L (ref 3.5–5.1)
Sodium: 135 mmol/L (ref 135–145)

## 2015-07-15 LAB — URINALYSIS, ROUTINE W REFLEX MICROSCOPIC
Bilirubin Urine: NEGATIVE
GLUCOSE, UA: NEGATIVE mg/dL
Ketones, ur: NEGATIVE mg/dL
Nitrite: NEGATIVE
Protein, ur: 30 mg/dL — AB
SPECIFIC GRAVITY, URINE: 1.02 (ref 1.005–1.030)
pH: 5 (ref 5.0–8.0)

## 2015-07-15 LAB — GLUCOSE, CAPILLARY
GLUCOSE-CAPILLARY: 89 mg/dL (ref 65–99)
Glucose-Capillary: 117 mg/dL — ABNORMAL HIGH (ref 65–99)
Glucose-Capillary: 118 mg/dL — ABNORMAL HIGH (ref 65–99)
Glucose-Capillary: 125 mg/dL — ABNORMAL HIGH (ref 65–99)
Glucose-Capillary: 156 mg/dL — ABNORMAL HIGH (ref 65–99)
Glucose-Capillary: 161 mg/dL — ABNORMAL HIGH (ref 65–99)

## 2015-07-15 LAB — URINE MICROSCOPIC-ADD ON

## 2015-07-15 LAB — CBC
HCT: 27.2 % — ABNORMAL LOW (ref 39.0–52.0)
HEMOGLOBIN: 9.3 g/dL — AB (ref 13.0–17.0)
MCH: 28.8 pg (ref 26.0–34.0)
MCHC: 34.2 g/dL (ref 30.0–36.0)
MCV: 84.2 fL (ref 78.0–100.0)
Platelets: 106 10*3/uL — ABNORMAL LOW (ref 150–400)
RBC: 3.23 MIL/uL — AB (ref 4.22–5.81)
RDW: 15.4 % (ref 11.5–15.5)
WBC: 8.4 10*3/uL (ref 4.0–10.5)

## 2015-07-15 LAB — WOUND CULTURE

## 2015-07-15 LAB — CREATININE, URINE, RANDOM: Creatinine, Urine: 60.87 mg/dL

## 2015-07-15 LAB — MAGNESIUM: Magnesium: 1.8 mg/dL (ref 1.7–2.4)

## 2015-07-15 LAB — SODIUM, URINE, RANDOM: Sodium, Ur: 16 mmol/L

## 2015-07-15 LAB — TRIGLYCERIDES: TRIGLYCERIDES: 77 mg/dL (ref <150)

## 2015-07-15 MED ORDER — FUROSEMIDE 10 MG/ML IJ SOLN
40.0000 mg | Freq: Four times a day (QID) | INTRAMUSCULAR | Status: AC
Start: 2015-07-15 — End: 2015-07-15
  Administered 2015-07-15 (×2): 40 mg via INTRAVENOUS
  Filled 2015-07-15 (×2): qty 4

## 2015-07-15 MED ORDER — DEXTROSE 5 % IV SOLN
2.0000 g | INTRAVENOUS | Status: AC
  Administered 2015-07-15 – 2015-07-21 (×7): 2 g via INTRAVENOUS
  Filled 2015-07-15 (×7): qty 2

## 2015-07-15 MED ORDER — VANCOMYCIN HCL 500 MG IV SOLR
500.0000 mg | Freq: Two times a day (BID) | INTRAVENOUS | Status: DC
  Administered 2015-07-15 – 2015-07-16 (×2): 500 mg via INTRAVENOUS
  Filled 2015-07-15 (×4): qty 500

## 2015-07-15 NOTE — Progress Notes (Signed)
RT advanced ETT 4 cm per MD verbal order. RT will continue to monitor.

## 2015-07-15 NOTE — Progress Notes (Signed)
PULMONARY / CRITICAL CARE MEDICINE   Name: Victor Mcdonald MRN: [id number] DOB: 1960-07-26    ADMISSION DATE:  07/23/2015  REFERRING MD:  Outside hospital ER  CHIEF COMPLAINT:  Can't breath  SUBJECTIVE:  Worsening hypoxemic respiratory failure   VITAL SIGNS: BP 111/68 mmHg  Pulse 62  Temp(Src) 97.9 F (36.6 C) (Core (Comment))  Resp 14  Ht 5\' 9"  (1.753 m)  Wt 66.633 kg (146 lb 14.4 oz)  BMI 21.68 kg/m2  SpO2 97%  VENTILATOR SETTINGS: Vent Mode:  [-] PRVC FiO2 (%):  [55 %-75 %] 70 % Set Rate:  [14 bmp] 14 bmp Vt Set:  [510 mL] 510 mL PEEP:  [5 cmH20] 5 cmH20 Plateau Pressure:  [23 cmH20-26 cmH20] 23 cmH20  INTAKE / OUTPUT: I/O last 3 completed shifts: In: 4833.7 [I.V.:2396.2; Other:40; NG/GT:1560; IV Piggyback:837.5] Out: 1910 [Urine:1910]  PHYSICAL EXAMINATION: General: on vent HENT: NCAT, ETT in place PULM: Crackles bilaterally, vent supported breaths CV: RRR, no mgr GI: BS+, soft, nontender MSK: normal bulk and tone DERM: arms/legs with edema Neuro: Sedated heavily on vent  LABS:  BMET  Recent Labs Lab 07/13/15 0344 07/14/15 0430 07/15/15 0507  NA 136 136 135  K 4.4 3.6 3.6  CL 105 105 105  CO2 25 26 25   BUN 9 12 14   CREATININE 1.01 1.39* 1.62*  GLUCOSE 108* 80 98    Electrolytes  Recent Labs Lab 07/20/2015 0903  07/13/15 0344 07/14/15 0430 07/15/15 0507  CALCIUM  --   < > 6.9* 7.0* 7.2*  MG 1.7  --  1.7 1.6* 1.8  PHOS 3.1  --  3.2 3.6  --   < > = values in this interval not displayed.  CBC  Recent Labs Lab 07/13/15 0344 07/14/15 0430 07/15/15 0507  WBC 15.3* 15.0* 8.4  HGB 6.5* 9.3* 9.3*  HCT 18.8* 26.8* 27.2*  PLT 156 116* 106*    Coag's  Recent Labs Lab 07/14/2015 0903  INR 1.24    Sepsis Markers  Recent Labs Lab 07/17/2015 0903 07/22/2015 1735 07/13/15 0344 07/14/15 0430  LATICACIDVEN  --  2.1*  --   --   PROCALCITON 1.75  --  6.80 3.74    ABG  Recent Labs Lab 07/23/2015 0639 07/13/15 0330  PHART 7.403  7.461*  PCO2ART 47.4* 33.4*  PO2ART 145.0* 73.2*    Liver Enzymes  Recent Labs Lab 07/30/2015 0903 07/13/15 0344  AST 41 39  ALT 19 17  ALKPHOS 919* 740*  BILITOT 1.0 1.0  ALBUMIN <1.0* <1.0*    Cardiac Enzymes No results for input(s): TROPONINI, PROBNP in the last 168 hours.  Glucose  Recent Labs Lab 07/14/15 1206 07/14/15 1536 07/14/15 2018 07/14/15 2358 07/15/15 0421 07/15/15 0832  GLUCAP 61* 64* 105* 117* 89 125*    Imaging Dg Chest Port 1 View  07/15/2015  CLINICAL DATA:  CHF and pancreatitis EXAM: PORTABLE CHEST 1 VIEW COMPARISON:  Yesterday FINDINGS: Left jugular central venous catheter retracted. The tip is in the upper SVC. Extensive bilateral airspace disease is stable. NG and endotracheal tubes stable. No pneumothorax. IMPRESSION: Stable extensive airspace disease. Left jugular central venous catheter retracted. Electronically Signed   By: Jolaine Click M.D.   On: 07/15/2015 08:56    STUDIES:  1/04 CT head >> negative 1/04 Abd u/s >> small ascites, normal CBG, no focal hepatic mass, absent GB 1/6 Echo> LVEF 55-60%, LA mildly dilated, no pFO, RVSP 39, small to mod effusion  CULTURES: 1/04 Blood cx (OSH) >> 1/04 Sputum >>  serratia 1/04 Lt stump wound >   1/7 RVP >  1/7 Flu >   ANTIBIOTICS: 1/04 Vancomycin >>  1/04 Zosyn >> 1/7 1/7 ceftriaxone >   SIGNIFICANT EVENTS: 1/04 Transfer from OSH, GI and ortho consulted, started D10 infusion 1/05 Transfuse 1 unit PRBC 1/06 Hypoglycemia  LINES/TUBES: 1/04 ETT >> 1/04 Lt IJ CVL >>   DISCUSSION: 55 yo male transferred from outside hospital with acute hypoxic respiratory failure, b/l pulmonary infiltrates, possible upper GI bleeding, acute encephalopathy, hypoglycemia, and possible Lt BKA stump infection. 1/7 CXR worse, oxygenation worse, looks like ARDS  ASSESSMENT / PLAN:  PULMONARY A: Acute hypoxic respiratory failure> ARDS vs pulm edema ETT high P:   Advance ETT Increase PEEP, decrease FiO2  60%, goal SpO2 > 90% TVol 8cc/kg F/u CXR PRN albuterol Lasix 40 mg IV x two on 1/07  CARDIOVASCULAR A:  Hx of HLD, HTN, chronic diastolic CHF. P:  PRN labetalol, hydralazine for SBP > 170 Check Echo   RENAL A:   Hypokalemia Hypomagnesemia AKI but volume up P:   Monitor BMET and UOP Replace electrolytes as needed Send U/A, urine eos, urine lytes  GASTROINTESTINAL A:   ?Upper GI bleed Hx of Hep C Hx of pancreatitis S/p Whipple procedure in 2011 Severe protein calorie malnutrition P:   Tube feeds Protonix  F/u with GI to determine timing of EGD Continue PPI gtt for now  HEMATOLOGIC A:   Acute blood loss anemia from reported upper GI bleed P:  F/u CBC Transfuse for Hb < 7 or bleeding SCD's for DVT prevention  INFECTIOUS A:   Sepsis from serratia HCAP and possible Lt BKA stump infection Check flu and viral panel today P:   Change antibiotics to cover serratia Maintain vanc for stump infection AKA per ortho  ENDOCRINE A:   Hypoglycemia Hx of DM type II P:   D10 in IV fluids at 40 ml/hr SSI  NEUROLOGIC A:   Acute metabolic encephalopathy. P:   RASS goal: -1 Fentanyl gtt  Family contact - Brother, Casimiro NeedleMichael >> [phone]  CC time 34 minutes.  Heber CarolinaBrent Fletcher Rathbun, MD [place] [facility] Pager: [phone] Cell: [phone] After 3pm or if no response, call [phone]

## 2015-07-15 NOTE — Progress Notes (Addendum)
Pharmacy Antibiotic Follow-up Note  Victor CornfieldRobert Mcdonald is a 55 y.o. year-old male admitted on 07/23/2015.  The patient is currently on day 4 of Vanco for ?L BKA stump infxn and Zosyn (should cover Serratia) changed to Rocephin for Serratia HCAP.  Assessment/Plan: The dose of Vancomycin will be adjusted to 500mg  IV q12h based on renal function.  Temp (24hrs), Avg:97.5 F (36.4 C), Min:96.6 F (35.9 C), Max:98.8 F (37.1 C)   Recent Labs Lab 07/16/2015 0902 07/13/15 0344 07/14/15 0430 07/15/15 0507  WBC 2.8* 15.3* 15.0* 8.4    Recent Labs Lab 08/03/2015 0902 08/08/2015 1735 07/13/15 0344 07/14/15 0430 07/15/15 0507  CREATININE 0.84 0.99 1.01 1.39* 1.62*   Estimated Creatinine Clearance: 49.7 mL/min (by C-G formula based on Cr of 1.62).    No Known Allergies  Antimicrobials this admission:  Vanc 1/4>> Zosyn 1/4>>1/7 Rocephin 1/7>>  Levels/dose changes this admission: none  Microbiology results: 1/4 MRSA - POS 1/4 Wound - NGTD 1/4: Endotracheal tube: Serratia only R to Ancef  Thank you for allowing pharmacy to be a part of this patient's care.  Pasty Spillersobertson, Justice Milliron Stillinger PharmD 07/15/2015 11:01 AM

## 2015-07-16 ENCOUNTER — Inpatient Hospital Stay (HOSPITAL_COMMUNITY): Payer: Medicare Other

## 2015-07-16 LAB — GLUCOSE, CAPILLARY
GLUCOSE-CAPILLARY: 110 mg/dL — AB (ref 65–99)
GLUCOSE-CAPILLARY: 124 mg/dL — AB (ref 65–99)
Glucose-Capillary: 94 mg/dL (ref 65–99)
Glucose-Capillary: 123 mg/dL — ABNORMAL HIGH (ref 65–99)
Glucose-Capillary: 120 mg/dL — ABNORMAL HIGH (ref 65–99)
Glucose-Capillary: 105 mg/dL — ABNORMAL HIGH (ref 65–99)

## 2015-07-16 LAB — CBC WITH DIFFERENTIAL/PLATELET
BASOS ABS: 0 10*3/uL (ref 0.0–0.1)
BASOS PCT: 0 %
EOS PCT: 2 %
Eosinophils Absolute: 0.1 10*3/uL (ref 0.0–0.7)
HCT: 26.2 % — ABNORMAL LOW (ref 39.0–52.0)
Hemoglobin: 8.8 g/dL — ABNORMAL LOW (ref 13.0–17.0)
LYMPHS PCT: 30 %
Lymphs Abs: 1.5 10*3/uL (ref 0.7–4.0)
MCH: 28.3 pg (ref 26.0–34.0)
MCHC: 33.6 g/dL (ref 30.0–36.0)
MCV: 84.2 fL (ref 78.0–100.0)
MONO ABS: 0.3 10*3/uL (ref 0.1–1.0)
Monocytes Relative: 5 %
NEUTROS ABS: 3.2 10*3/uL (ref 1.7–7.7)
Neutrophils Relative %: 63 %
PLATELETS: 85 10*3/uL — AB (ref 150–400)
RBC: 3.11 MIL/uL — AB (ref 4.22–5.81)
RDW: 15.3 % (ref 11.5–15.5)
WBC: 5.1 10*3/uL (ref 4.0–10.5)

## 2015-07-16 LAB — BASIC METABOLIC PANEL
ANION GAP: 6 (ref 5–15)
BUN: 16 mg/dL (ref 6–20)
CALCIUM: 7.1 mg/dL — AB (ref 8.9–10.3)
CO2: 24 mmol/L (ref 22–32)
Chloride: 103 mmol/L (ref 101–111)
Creatinine, Ser: 1.71 mg/dL — ABNORMAL HIGH (ref 0.61–1.24)
GFR calc Af Amer: 51 mL/min — ABNORMAL LOW (ref >60)
GFR, EST NON AFRICAN AMERICAN: 44 mL/min — AB (ref >60)
GLUCOSE: 130 mg/dL — AB (ref 65–99)
POTASSIUM: 3.4 mmol/L — AB (ref 3.5–5.1)
SODIUM: 133 mmol/L — AB (ref 135–145)

## 2015-07-16 MED ORDER — POTASSIUM CHLORIDE 20 MEQ/15ML (10%) PO SOLN
40.0000 meq | Freq: Two times a day (BID) | ORAL | Status: AC
  Administered 2015-07-16 (×2): 40 meq
  Filled 2015-07-16: qty 30

## 2015-07-16 MED ORDER — POTASSIUM CHLORIDE 20 MEQ/15ML (10%) PO SOLN
40.0000 meq | Freq: Once | ORAL | Status: DC
  Filled 2015-07-16: qty 30

## 2015-07-16 MED ORDER — FUROSEMIDE 10 MG/ML IJ SOLN
40.0000 mg | Freq: Four times a day (QID) | INTRAMUSCULAR | Status: DC
  Filled 2015-07-16: qty 4

## 2015-07-16 MED ORDER — FUROSEMIDE 10 MG/ML IJ SOLN
40.0000 mg | Freq: Four times a day (QID) | INTRAMUSCULAR | Status: AC
  Administered 2015-07-16 – 2015-07-17 (×4): 40 mg via INTRAVENOUS
  Filled 2015-07-16 (×3): qty 4

## 2015-07-16 NOTE — Progress Notes (Signed)
PULMONARY / CRITICAL CARE MEDICINE   Name: Victor CornfieldRobert Mcdonald MRN: [id number] DOB: 07/20/1960    ADMISSION DATE:  08/03/2015  REFERRING MD:  Outside hospital ER  CHIEF COMPLAINT:  Can't breath  SUBJECTIVE:  Hypoxemia stable Tube feedings held because of high gastric residual volume (1500cc) No bleeding  VITAL SIGNS: BP 157/76 mmHg  Pulse 58  Temp(Src) 98.4 F (36.9 C) (Core (Comment))  Resp 14  Ht 5\' 9"  (1.753 m)  Wt 66.633 kg (146 lb 14.4 oz)  BMI 21.68 kg/m2  SpO2 100%  VENTILATOR SETTINGS: Vent Mode:  [-] PRVC FiO2 (%):  [40 %-60 %] 40 % Set Rate:  [14 bmp-1414 bmp] 1414 bmp Vt Set:  [510 mL] 510 mL PEEP:  [10 cmH20] 10 cmH20 Plateau Pressure:  [25 cmH20-31 cmH20] 25 cmH20  INTAKE / OUTPUT: I/O last 3 completed shifts: In: 4653.1 [I.V.:2593.1; NG/GT:1510; IV Piggyback:550] Out: 3125 [Urine:1725; Emesis/NG output:1400]  PHYSICAL EXAMINATION: General: on vent HENT: NCAT, ETT in place PULM: Crackles bilaterally, vent supported breaths CV: RRR, no mgr GI: BS+, soft, nontender MSK: normal bulk and tone DERM: arms/legs with edema Neuro: Sedated heavily on vent  LABS:  BMET  Recent Labs Lab 07/14/15 0430 07/15/15 0507 07/16/15 0410  NA 136 135 133*  K 3.6 3.6 3.4*  CL 105 105 103  CO2 26 25 24   BUN 12 14 16   CREATININE 1.39* 1.62* 1.71*  GLUCOSE 80 98 130*    Electrolytes  Recent Labs Lab 07/14/2015 0903  07/13/15 0344 07/14/15 0430 07/15/15 0507 07/16/15 0410  CALCIUM  --   < > 6.9* 7.0* 7.2* 7.1*  MG 1.7  --  1.7 1.6* 1.8  --   PHOS 3.1  --  3.2 3.6  --   --   < > = values in this interval not displayed.  CBC  Recent Labs Lab 07/14/15 0430 07/15/15 0507 07/16/15 0410  WBC 15.0* 8.4 5.1  HGB 9.3* 9.3* 8.8*  HCT 26.8* 27.2* 26.2*  PLT 116* 106* 85*    Coag's  Recent Labs Lab 07/31/2015 0903  INR 1.24    Sepsis Markers  Recent Labs Lab 07/24/2015 0903 07/28/2015 1735 07/13/15 0344 07/14/15 0430  LATICACIDVEN  --  2.1*  --   --    PROCALCITON 1.75  --  6.80 3.74    ABG  Recent Labs Lab 07/24/2015 0639 07/13/15 0330  PHART 7.403 7.461*  PCO2ART 47.4* 33.4*  PO2ART 145.0* 73.2*    Liver Enzymes  Recent Labs Lab 07/11/2015 0903 07/13/15 0344  AST 41 39  ALT 19 17  ALKPHOS 919* 740*  BILITOT 1.0 1.0  ALBUMIN <1.0* <1.0*    Cardiac Enzymes No results for input(s): TROPONINI, PROBNP in the last 168 hours.  Glucose  Recent Labs Lab 07/15/15 1305 07/15/15 1633 07/15/15 2034 07/16/15 0052 07/16/15 0423 07/16/15 0730  GLUCAP 118* 161* 156* 110* 124* 94    Imaging 1/8 CXR > improved aeration bilaterally, ETT, CVL in place  STUDIES:  1/04 CT head >> negative 1/04 Abd u/s >> small ascites, normal CBG, no focal hepatic mass, absent GB 1/6 Echo> LVEF 55-60%, LA mildly dilated, no pFO, RVSP 39, small to mod effusion  CULTURES: 1/04 Blood cx (OSH) >> 1/04 Sputum >> serratia 1/04 Lt stump wound > group b strep  1/7 RVP >  1/7 Flu >   ANTIBIOTICS: 1/04 Vancomycin >> 1/8 1/04 Zosyn >> 1/7 1/7 ceftriaxone >   SIGNIFICANT EVENTS: 1/04 Transfer from OSH, GI and ortho consulted, started D10  infusion 1/05 Transfuse 1 unit PRBC 1/06 Hypoglycemia  LINES/TUBES: 1/04 ETT >> 1/04 Lt IJ CVL >>   DISCUSSION: 55 yo male transferred from outside hospital with acute hypoxic respiratory failure, b/l pulmonary infiltrates, possible upper GI bleeding, acute encephalopathy, hypoglycemia, and possible Lt BKA stump infection. 1/8 CXR improved but likely due to increased PEEP, may be pulmonary edema based on exam.  ASSESSMENT / PLAN:  PULMONARY A: Acute hypoxic respiratory failure> ARDS vs pulm edema ETT high P:   Wean PEEP/FiO2 for goal PaO2 55-65 Repeat ABG TVol 8cc/kg diurese today F/u CXR PRN albuterol Lasix 40 mg IV x two on 1/07  CARDIOVASCULAR A:  Hx of HLD, HTN, chronic diastolic CHF P:  PRN labetalol, hydralazine for SBP > 170 Check Echo Lasix again today   RENAL A:    Hypokalemia AKI but volume up, FENA 1.2% P:   Monitor BMET and UOP Replace electrolytes as needed Lasix again today  GASTROINTESTINAL A:   ?Upper GI bleed Hx of Hep C Hx of pancreatitis S/p Whipple procedure in 2011 Severe protein calorie malnutrition Ileus> likely narcotic related P:   Tube feeds > advance again today Minimize fentanyl Protonix  F/u with GI to determine timing of EGD Continue PPI gtt for now  HEMATOLOGIC A:   Acute blood loss anemia from reported upper GI bleed P:  F/u CBC Transfuse for Hb < 7 or bleeding SCD's for DVT prevention  INFECTIOUS A:   HCAP  Lt BKA stump infection  P:   F/U flu, viral panel Continue ceftriaxone for serratia and group b strep in stump AKA per ortho  ENDOCRINE A:   Hypoglycemia Hx of DM type II P:   D10 in IV fluids at 40 ml/hr SSI  NEUROLOGIC A:   Acute metabolic encephalopathy P:   RASS goal: -1 Fentanyl gtt> minimize dose Propofol gtt Daily WUA  Family contact - Hassie Bruce >> [phone]  CC time 35 minutes.  Victor Doolittle, MD [place] [facility] Pager: [phone] Cell: [phone] After 3pm or if no response, call [phone]

## 2015-07-17 ENCOUNTER — Inpatient Hospital Stay (HOSPITAL_COMMUNITY): Payer: Medicare Other

## 2015-07-17 LAB — BLOOD GAS, ARTERIAL
ACID-BASE DEFICIT: 0.4 mmol/L (ref 0.0–2.0)
Bicarbonate: 23.1 mEq/L (ref 20.0–24.0)
DRAWN BY: 36277
FIO2: 0.4
LHR: 14 {breaths}/min
O2 Saturation: 99 %
PEEP/CPAP: 5 cmH2O
PO2 ART: 163 mmHg — AB (ref 80.0–100.0)
Patient temperature: 96
TCO2: 24.2 mmol/L (ref 0–100)
VT: 510 mL
pCO2 arterial: 31.5 mmHg — ABNORMAL LOW (ref 35.0–45.0)
pH, Arterial: 7.471 — ABNORMAL HIGH (ref 7.350–7.450)

## 2015-07-17 LAB — GLUCOSE, CAPILLARY
GLUCOSE-CAPILLARY: 106 mg/dL — AB (ref 65–99)
Glucose-Capillary: 110 mg/dL — ABNORMAL HIGH (ref 65–99)
Glucose-Capillary: 114 mg/dL — ABNORMAL HIGH (ref 65–99)
Glucose-Capillary: 127 mg/dL — ABNORMAL HIGH (ref 65–99)

## 2015-07-17 LAB — CBC WITH DIFFERENTIAL/PLATELET
Basophils Absolute: 0 10*3/uL (ref 0.0–0.1)
Basophils Relative: 1 %
EOS PCT: 2 %
Eosinophils Absolute: 0.1 10*3/uL (ref 0.0–0.7)
HEMATOCRIT: 24.6 % — AB (ref 39.0–52.0)
Hemoglobin: 8.7 g/dL — ABNORMAL LOW (ref 13.0–17.0)
LYMPHS PCT: 35 %
Lymphs Abs: 1.6 10*3/uL (ref 0.7–4.0)
MCH: 29.3 pg (ref 26.0–34.0)
MCHC: 35.4 g/dL (ref 30.0–36.0)
MCV: 82.8 fL (ref 78.0–100.0)
MONO ABS: 0.5 10*3/uL (ref 0.1–1.0)
MONOS PCT: 11 %
NEUTROS ABS: 2.4 10*3/uL (ref 1.7–7.7)
Neutrophils Relative %: 52 %
PLATELETS: 79 10*3/uL — AB (ref 150–400)
RBC: 2.97 MIL/uL — ABNORMAL LOW (ref 4.22–5.81)
RDW: 15.3 % (ref 11.5–15.5)
WBC: 4.6 10*3/uL (ref 4.0–10.5)

## 2015-07-17 LAB — BASIC METABOLIC PANEL
Anion gap: 6 (ref 5–15)
BUN: 18 mg/dL (ref 6–20)
CALCIUM: 7.1 mg/dL — AB (ref 8.9–10.3)
CO2: 24 mmol/L (ref 22–32)
Chloride: 104 mmol/L (ref 101–111)
Creatinine, Ser: 1.71 mg/dL — ABNORMAL HIGH (ref 0.61–1.24)
GFR calc Af Amer: 51 mL/min — ABNORMAL LOW (ref >60)
GFR, EST NON AFRICAN AMERICAN: 44 mL/min — AB (ref >60)
GLUCOSE: 108 mg/dL — AB (ref 65–99)
Potassium: 3.7 mmol/L (ref 3.5–5.1)
Sodium: 134 mmol/L — ABNORMAL LOW (ref 135–145)

## 2015-07-17 MED ORDER — FUROSEMIDE 10 MG/ML IJ SOLN
40.0000 mg | Freq: Once | INTRAMUSCULAR | Status: AC
  Administered 2015-07-17: 40 mg via INTRAVENOUS
  Filled 2015-07-17: qty 4

## 2015-07-17 MED ORDER — METOCLOPRAMIDE HCL 5 MG/ML IJ SOLN
5.0000 mg | Freq: Two times a day (BID) | INTRAMUSCULAR | Status: DC
  Administered 2015-07-17 – 2015-07-26 (×20): 5 mg via INTRAVENOUS
  Filled 2015-07-17: qty 2
  Filled 2015-07-17: qty 1
  Filled 2015-07-17 (×2): qty 2
  Filled 2015-07-17: qty 1
  Filled 2015-07-17 (×3): qty 2
  Filled 2015-07-17 (×2): qty 1
  Filled 2015-07-17: qty 2
  Filled 2015-07-17: qty 1
  Filled 2015-07-17: qty 2
  Filled 2015-07-17 (×3): qty 1
  Filled 2015-07-17 (×2): qty 2
  Filled 2015-07-17 (×6): qty 1

## 2015-07-17 MED ORDER — DEXTROSE-NACL 5-0.9 % IV SOLN
INTRAVENOUS | Status: DC
  Administered 2015-07-17 – 2015-07-18 (×2): via INTRAVENOUS
  Administered 2015-07-20: 30 mL/h via INTRAVENOUS

## 2015-07-17 NOTE — Progress Notes (Signed)
eLink Physician-Brief Progress Note Patient Name: Victor CornfieldRobert Stetzel DOB: 1960/08/19 MRN: [id number]   Date of Service  07/17/2015  HPI/Events of Note  cvpo 2, low ur op. SBP 145 sbp. Notes indicae plan for lasix due to ARDS  eICU Interventions  D.w RN -> go ahead with lasix     Intervention Category Intermediate Interventions: Other:  Kavian Peters 07/17/2015, 8:51 PM

## 2015-07-17 NOTE — Progress Notes (Signed)
MD paged concerning pt's episode of large yellow emesis. 150 cc removed from OG when hooked to low intermittent wall suction.  Tube feeds held. Orders received for KUB and follow up when film is taken. Orders placed. Will continue to monitor pt closely.

## 2015-07-17 NOTE — Progress Notes (Signed)
PULMONARY / CRITICAL CARE MEDICINE   Name: Victor Mcdonald MRN: [id number] DOB: 02-15-1961    ADMISSION DATE:  2015-07-25  REFERRING MD:  Outside hospital ER, [facility]  CHIEF COMPLAINT:  Can't breathe  SUBJECTIVE:  Hypoxemia stable Trickle TF restarted  Remains encephalopathic.  Mucoid BM's > no blood  VITAL SIGNS: BP 133/76 mmHg  Pulse 68  Temp(Src) 97 F (36.1 C) (Core (Comment))  Resp 16  Ht 5\' 9"  (1.753 m)  Wt 66.633 kg (146 lb 14.4 oz)  BMI 21.68 kg/m2  SpO2 94%  VENTILATOR SETTINGS: Vent Mode:  [-] PRVC FiO2 (%):  [40 %] 40 % Set Rate:  [14 bmp] 14 bmp Vt Set:  [510 mL] 510 mL PEEP:  [5 cmH20-8 cmH20] 5 cmH20 Plateau Pressure:  [18 cmH20-25 cmH20] 23 cmH20  INTAKE / OUTPUT: I/O last 3 completed shifts: In: 3327.7 [I.V.:2387.7; NG/GT:640; IV Piggyback:300] Out: 5785 [Urine:4285; Emesis/NG output:1500]  PHYSICAL EXAMINATION: General: on vent HENT: NCAT, ETT in place PULM: Crackles bilaterally, vent supported breaths CV: RRR, no mgr GI: BS+, soft, nontender MSK: normal bulk and tone DERM: arms/legs with edema Neuro: Sedated heavily on vent  LABS:  BMET  Recent Labs Lab 07/15/15 0507 07/16/15 0410 07/17/15 0346  NA 135 133* 134*  K 3.6 3.4* 3.7  CL 105 103 104  CO2 25 24 24   BUN 14 16 18   CREATININE 1.62* 1.71* 1.71*  GLUCOSE 98 130* 108*    Electrolytes  Recent Labs Lab 07-25-15 0903  07/13/15 0344 07/14/15 0430 07/15/15 0507 07/16/15 0410 07/17/15 0346  CALCIUM  --   < > 6.9* 7.0* 7.2* 7.1* 7.1*  MG 1.7  --  1.7 1.6* 1.8  --   --   PHOS 3.1  --  3.2 3.6  --   --   --   < > = values in this interval not displayed.  CBC  Recent Labs Lab 07/15/15 0507 07/16/15 0410 07/17/15 0346  WBC 8.4 5.1 4.6  HGB 9.3* 8.8* 8.7*  HCT 27.2* 26.2* 24.6*  PLT 106* 85* 79*    Coag's  Recent Labs Lab 07-25-2015 0903  INR 1.24    Sepsis Markers  Recent Labs Lab 07/25/2015 0903 July 25, 2015 1735 07/13/15 0344 07/14/15 0430   LATICACIDVEN  --  2.1*  --   --   PROCALCITON 1.75  --  6.80 3.74    ABG  Recent Labs Lab 2015/07/25 0639 07/13/15 0330 07/17/15 0400  PHART 7.403 7.461* 7.471*  PCO2ART 47.4* 33.4* 31.5*  PO2ART 145.0* 73.2* 163*    Liver Enzymes  Recent Labs Lab Jul 25, 2015 0903 07/13/15 0344  AST 41 39  ALT 19 17  ALKPHOS 919* 740*  BILITOT 1.0 1.0  ALBUMIN <1.0* <1.0*    Cardiac Enzymes No results for input(s): TROPONINI, PROBNP in the last 168 hours.  Glucose  Recent Labs Lab 07/16/15 0423 07/16/15 0730 07/16/15 1251 07/16/15 1720 07/16/15 1935 07/16/15 2354  GLUCAP 124* 94 123* 120* 105* 127*    Imaging 1/8 CXR > improved aeration bilaterally, ETT, CVL in place  STUDIES:  1/04 CT head >> negative 1/04 Abd u/s >> small ascites, normal CBG, no focal hepatic mass, absent GB 1/6 Echo> LVEF 55-60%, LA mildly dilated, no pFO, RVSP 39, small to mod effusion  CULTURES: 1/04 Blood cx (OSH) >> 1/04 Sputum >> serratia >> pan-sens.  1/04 Lt stump wound > group b strep  1/7 RVP >  1/7 Flu >   ANTIBIOTICS: 1/04 Vancomycin >> 1/8 1/04 Zosyn >>  1/7 1/7 ceftriaxone >   SIGNIFICANT EVENTS: 1/04 Transfer from OSH, GI and ortho consulted, started D10 infusion 1/05 Transfuse 1 unit PRBC 1/06 Hypoglycemia  LINES/TUBES: 1/04 ETT >> 1/04 Lt IJ CVL >>   DISCUSSION: 55 yo male transferred from outside hospital with acute hypoxic respiratory failure, b/l pulmonary infiltrates, possible upper GI bleeding, acute encephalopathy, hypoglycemia, and possible Lt BKA stump infection. 1/8 CXR improved but likely due to increased PEEP, may be pulmonary edema based on exam.  ASSESSMENT / PLAN:  PULMONARY A: Acute hypoxic respiratory failure> ARDS vs pulm edema ETT high P:   Wean PEEP/FiO2 for goal PaO2 55-65 Follow ABG TVol 8cc/kg F/u CXR PRN albuterol Dosing lasix daily > repeat 1/9  CARDIOVASCULAR A:  Hx of HLD, HTN, chronic diastolic CHF P:  PRN labetalol, hydralazine  for SBP > 170 Check Echo    RENAL A:   Hypokalemia AKI but volume up, FENA 1.2% P:   Monitor BMET and UOP Replace electrolytes as needed Lasix again today 1/9, note rising S Cr. Probably little room for continued diuresis  GASTROINTESTINAL A:   ?Upper GI bleed at presentation Hx of Hep C Hx of pancreatitis S/p Whipple procedure in 2011 Severe protein calorie malnutrition Ileus> likely narcotic related P:   Tube feeds > continue trickle for another 24h then assess for increase Minimize fentanyl Protonix  GI with no plan for EGD at this time pending stabilization of other issues.  PPI to intermittent   HEMATOLOGIC A:   Acute blood loss anemia from reported upper GI bleed P:  F/u CBC Transfuse for Hb < 7 or bleeding SCD's for DVT prevention  INFECTIOUS A:   HCAP  Lt BKA stump infection  P:   F/U flu, viral panel  Continue ceftriaxone for serratia and group b strep in stump Will need an AKA per ortho, Again has been deferred pending overall improvement.   ENDOCRINE A:   Hypoglycemia Hx of DM type II P:   D10  > change to d5 on 1/9 SSI  NEUROLOGIC A:   Acute metabolic encephalopathy P:   RASS goal: -1 Fentanyl gtt> minimize dose Propofol gtt Daily WUA  Family contact - Hassie BruceBrother, Michael >> [phone]  Independent CC time 35 minutes.  Levy Pupaobert Marjean Imperato, MD, PhD 07/17/2015, 9:04 AM [facility] [phone] or if no answer [phone]

## 2015-07-17 NOTE — Progress Notes (Signed)
MD paged as KUB just taken. Orders received for Reglan. Will continue to monitor pt closely.

## 2015-07-17 NOTE — Progress Notes (Addendum)
Nutrition Follow Up  DOCUMENTATION CODES:   Not applicable  INTERVENTION:    As medically appropriate, advance Vital AF 1.2 formula by 10 ml every 8-12 hours to goal rate of 60 ml/hr   Consider adding prokinetic (ie Reglan) or placing post-pyloric feeding tube via CORTRAK  TF regimen to provide 1728 kcals, 108 gm protein, 1168 ml of free water  NUTRITION DIAGNOSIS:   Inadequate oral intake related to inability to eat as evidenced by NPO status, ongoing  GOAL:   Patient will meet greater than or equal to 90% of their needs, progressing  MONITOR:   TF tolerance, Vent status, Labs, Weight trends, Skin, I & O's  ASSESSMENT:   55 yo Male was brought to ER. He was hypoxic. He required intubation. Also noted to have hematemesis. Reported that stool occult blood was negative. He was anemic and given PRBC transfusion. He was noted to be hypoglycemic. His CXR showed infiltrates. He had blood cx's done at outside hospital, and given vancomycin, zosyn. He had CT head that was negative. [facility]  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  Patient is currently intubated on ventilator support MV: 6.4 L/min Temp (24hrs), Avg:97.5 F (36.4 C), Min:96.3 F (35.7 C), Max:99.1 F (37.3 C)   Propofol: 5.5 ml/hr -----> 145 fat kcals  Vital AF 1.2 formula held 1/7 due to high residuals (1500 ml).  Spoke with RN.  TF resumed per CCM this AM at 10 ml/hr, however, currently off due to vomiting.  RD continues to suspects malnutrition.    Labs reviewed.  Mg, K+ WNL.  Diet Order:  Diet NPO time specified  Skin:  Wound (see comment) (L stump wound)  Last BM:  1/9  Height:   Ht Readings from Last 1 Encounters:  07/14/2015 5\' 9"  (1.753 m)    Weight:   Wt Readings from Last 1 Encounters:  07/15/15 146 lb 14.4 oz (66.633 kg)    Ideal Body Weight:  73 kg  BMI:  Body mass index is 21.68 kg/(m^2).  Estimated Nutritional Needs:    Kcal:  1652  Protein:  95-105 gm  Fluid:  per MD  EDUCATION NEEDS:   No education needs identified at this time  Maureen ChattersKatie Shallon Yaklin, RD, LDN Pager #: [phone] After-Hours Pager #: [phone]

## 2015-07-18 ENCOUNTER — Inpatient Hospital Stay (HOSPITAL_COMMUNITY): Payer: Medicare Other

## 2015-07-18 LAB — GLUCOSE, CAPILLARY
GLUCOSE-CAPILLARY: 107 mg/dL — AB (ref 65–99)
GLUCOSE-CAPILLARY: 77 mg/dL (ref 65–99)
GLUCOSE-CAPILLARY: 81 mg/dL (ref 65–99)
GLUCOSE-CAPILLARY: 87 mg/dL (ref 65–99)
GLUCOSE-CAPILLARY: 89 mg/dL (ref 65–99)
Glucose-Capillary: 86 mg/dL (ref 65–99)
Glucose-Capillary: 93 mg/dL (ref 65–99)

## 2015-07-18 LAB — RESPIRATORY VIRUS PANEL
Adenovirus: NEGATIVE
INFLUENZA A: NEGATIVE
INFLUENZA B 1: NEGATIVE
METAPNEUMOVIRUS: NEGATIVE
PARAINFLUENZA 1 A: NEGATIVE
PARAINFLUENZA 3 A: NEGATIVE
Parainfluenza 2: NEGATIVE
Respiratory Syncytial Virus A: NEGATIVE
Respiratory Syncytial Virus B: NEGATIVE
Rhinovirus: NEGATIVE

## 2015-07-18 LAB — BASIC METABOLIC PANEL
ANION GAP: 4 — AB (ref 5–15)
ANION GAP: 9 (ref 5–15)
BUN: 18 mg/dL (ref 6–20)
BUN: 19 mg/dL (ref 6–20)
CHLORIDE: 106 mmol/L (ref 101–111)
CO2: 25 mmol/L (ref 22–32)
CO2: 23 mmol/L (ref 22–32)
Calcium: 7 mg/dL — ABNORMAL LOW (ref 8.9–10.3)
Calcium: 7.7 mg/dL — ABNORMAL LOW (ref 8.9–10.3)
Chloride: 107 mmol/L (ref 101–111)
Creatinine, Ser: 1.7 mg/dL — ABNORMAL HIGH (ref 0.61–1.24)
Creatinine, Ser: 1.72 mg/dL — ABNORMAL HIGH (ref 0.61–1.24)
GFR calc non Af Amer: 44 mL/min — ABNORMAL LOW (ref >60)
GFR, EST AFRICAN AMERICAN: 51 mL/min — AB (ref >60)
GFR, EST AFRICAN AMERICAN: 51 mL/min — AB (ref >60)
GFR, EST NON AFRICAN AMERICAN: 44 mL/min — AB (ref >60)
GLUCOSE: 123 mg/dL — AB (ref 65–99)
GLUCOSE: 80 mg/dL (ref 65–99)
POTASSIUM: 3.4 mmol/L — AB (ref 3.5–5.1)
POTASSIUM: 3.7 mmol/L (ref 3.5–5.1)
Sodium: 136 mmol/L (ref 135–145)
Sodium: 138 mmol/L (ref 135–145)

## 2015-07-18 LAB — TRIGLYCERIDES: Triglycerides: 157 mg/dL — ABNORMAL HIGH (ref <150)

## 2015-07-18 LAB — CBC
HEMATOCRIT: 27.1 % — AB (ref 39.0–52.0)
HEMOGLOBIN: 9.5 g/dL — AB (ref 13.0–17.0)
MCH: 29.5 pg (ref 26.0–34.0)
MCHC: 35.1 g/dL (ref 30.0–36.0)
MCV: 84.2 fL (ref 78.0–100.0)
Platelets: 116 10*3/uL — ABNORMAL LOW (ref 150–400)
RBC: 3.22 MIL/uL — AB (ref 4.22–5.81)
RDW: 15.5 % (ref 11.5–15.5)
WBC: 6.5 10*3/uL (ref 4.0–10.5)

## 2015-07-18 LAB — CULTURE, RESPIRATORY W GRAM STAIN

## 2015-07-18 MED ORDER — MIDAZOLAM HCL 2 MG/2ML IJ SOLN
2.0000 mg | INTRAMUSCULAR | Status: DC | PRN
  Administered 2015-07-18: 2 mg via INTRAVENOUS
  Administered 2015-07-18 (×2): 4 mg via INTRAVENOUS
  Filled 2015-07-18 (×2): qty 2
  Filled 2015-07-18 (×2): qty 4

## 2015-07-18 MED ORDER — FUROSEMIDE 10 MG/ML IJ SOLN
40.0000 mg | Freq: Two times a day (BID) | INTRAMUSCULAR | Status: AC
  Administered 2015-07-18 (×2): 40 mg via INTRAVENOUS
  Filled 2015-07-18 (×2): qty 4

## 2015-07-18 MED ORDER — MIDAZOLAM HCL 2 MG/2ML IJ SOLN
2.0000 mg | INTRAMUSCULAR | Status: DC | PRN
  Administered 2015-07-18 – 2015-07-19 (×3): 4 mg via INTRAVENOUS
  Filled 2015-07-18 (×2): qty 4
  Filled 2015-07-18: qty 2
  Filled 2015-07-18: qty 4

## 2015-07-18 MED ORDER — CLONIDINE HCL 0.1 MG/24HR TD PTWK
0.1000 mg | MEDICATED_PATCH | TRANSDERMAL | Status: DC
  Administered 2015-07-18: 0.1 mg via TRANSDERMAL
  Filled 2015-07-18 (×2): qty 1

## 2015-07-18 MED ORDER — HYDRALAZINE HCL 50 MG PO TABS
50.0000 mg | ORAL_TABLET | Freq: Four times a day (QID) | ORAL | Status: DC
  Administered 2015-07-18 – 2015-07-20 (×8): 50 mg
  Filled 2015-07-18 (×8): qty 1

## 2015-07-18 NOTE — Progress Notes (Signed)
RT found patients tube 23 at the lip. RT advanced patients tube back to 26 at the lip. Pt tolerated well. RT will continue to monitor.

## 2015-07-18 NOTE — Progress Notes (Signed)
Md paged concerning pt's restlessness. Pt attempting to get out of bed, pull. ETT, shift hips in bed, and slide down to foot of bed. Mittens applied, Fent increased to 150 mcg from 100 and bolus given,  and orders received for Versed.

## 2015-07-18 NOTE — Progress Notes (Signed)
PULMONARY / CRITICAL CARE MEDICINE   Name: Kathreen CornfieldRobert Bluestone MRN: [id number] DOB: 16-Aug-1960    ADMISSION DATE:  07/21/2015  REFERRING MD:  [facility] [place]  CHIEF COMPLAINT:  Can't breathe  SUBJECTIVE:  Hypoxemia stable Trickle TF stopped due to residual, reglan started Remains encephalopathic but starting to wake up Mucoid BM's > no blood  VITAL SIGNS: BP 169/86 mmHg  Pulse 68  Temp(Src) 97.7 F (36.5 C) (Oral)  Resp 18  Ht 5\' 9"  (1.753 m)  Wt 66.633 kg (146 lb 14.4 oz)  BMI 21.68 kg/m2  SpO2 99%  VENTILATOR SETTINGS: Vent Mode:  [-] PRVC FiO2 (%):  [30 %-40 %] 30 % Set Rate:  [14 bmp] 14 bmp Vt Set:  [510 mL] 510 mL PEEP:  [5 cmH20] 5 cmH20 Plateau Pressure:  [14 cmH20-24 cmH20] 21 cmH20  INTAKE / OUTPUT: I/O last 3 completed shifts: In: 2324 [I.V.:1904; NG/GT:370; IV Piggyback:50] Out: 3395 [Urine:3125; Emesis/NG output:270]  PHYSICAL EXAMINATION: General: on vent HENT: NCAT, ETT in place PULM: Crackles bilaterally, vent supported breaths CV: RRR, no mgr GI: BS+, soft, nontender MSK: normal bulk and tone DERM: arms/legs with edema Neuro: sedated but more awake today, followed commands for RN earlier.  LABS:  BMET  Recent Labs Lab 07/16/15 0410 07/17/15 0346 07/18/15 0515  NA 133* 134* 136  K 3.4* 3.7 3.4*  CL 103 104 107  CO2 24 24 25   BUN 16 18 18   CREATININE 1.71* 1.71* 1.70*  GLUCOSE 130* 108* 123*    Electrolytes  Recent Labs Lab 07/26/2015 0903  07/13/15 0344 07/14/15 0430 07/15/15 0507 07/16/15 0410 07/17/15 0346 07/18/15 0515  CALCIUM  --   < > 6.9* 7.0* 7.2* 7.1* 7.1* 7.0*  MG 1.7  --  1.7 1.6* 1.8  --   --   --   PHOS 3.1  --  3.2 3.6  --   --   --   --   < > = values in this interval not displayed.  CBC  Recent Labs Lab 07/16/15 0410 07/17/15 0346 07/18/15 0515  WBC 5.1 4.6 6.5  HGB 8.8* 8.7* 9.5*  HCT 26.2* 24.6* 27.1*  PLT 85* 79* 116*    Coag's  Recent Labs Lab 07/13/2015 0903  INR 1.24     Sepsis Markers  Recent Labs Lab 07/21/2015 0903 07/20/2015 1735 07/13/15 0344 07/14/15 0430  LATICACIDVEN  --  2.1*  --   --   PROCALCITON 1.75  --  6.80 3.74    ABG  Recent Labs Lab 07/18/2015 0639 07/13/15 0330 07/17/15 0400  PHART 7.403 7.461* 7.471*  PCO2ART 47.4* 33.4* 31.5*  PO2ART 145.0* 73.2* 163*    Liver Enzymes  Recent Labs Lab 08/07/2015 0903 07/13/15 0344  AST 41 39  ALT 19 17  ALKPHOS 919* 740*  BILITOT 1.0 1.0  ALBUMIN <1.0* <1.0*    Cardiac Enzymes No results for input(s): TROPONINI, PROBNP in the last 168 hours.  Glucose  Recent Labs Lab 07/17/15 0814 07/17/15 1135 07/17/15 1608 07/18/15 0008 07/18/15 0352 07/18/15 0826  GLUCAP 110* 114* 106* 87 81 86    Imaging 1/8 CXR > improved aeration bilaterally, ETT, CVL in place  STUDIES:  1/04 CT head >> negative 1/04 Abd u/s >> small ascites, normal CBG, no focal hepatic mass, absent GB 1/6 Echo> LVEF 55-60%, LA mildly dilated, no pFO, RVSP 39, small to mod effusion  CULTURES: 1/04 Blood cx (OSH) >> 1/04 Sputum >> serratia >> pan-sens.  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 1/8 CXR improved but likely due to increased PEEP, may be pulmonary edema based on exam.  ASSESSMENT / PLAN:  PULMONARY A: Acute hypoxic respiratory failure> ARDS vs pulm edema ETT high P:   Wean PEEP/FiO2 for goal PaO2 55-65 Follow ABG TVol 8cc/kg F/u CXR PRN albuterol Dosing lasix daily > repeat  1/10  CARDIOVASCULAR A:  Hx of HLD, HTN, chronic diastolic CHF P:  PRN labetalol, hydralazine for SBP > 170  RENAL A:   Hypokalemia AKI but volume up, FENA 1.2% P:   Monitor BMET and UOP Replace electrolytes as needed Lasix again today 1/10, note rising S Cr. Probably little room for continued diuresis  GASTROINTESTINAL A:   ?Upper GI bleed at presentation Hx of Hep C Hx of pancreatitis S/p Whipple procedure in 2011 Severe protein calorie malnutrition Ileus> likely narcotic related P:   Tube feeds > retry trickle on 1/11 after on reglan Minimize fentanyl Protonix  GI with no plan for EGD at this time pending stabilization of other issues.  PPI to intermittent   HEMATOLOGIC A:   Acute blood loss anemia from reported upper GI bleed P:  F/u CBC Transfuse for Hb < 7 or bleeding SCD's for DVT prevention  INFECTIOUS A:   HCAP  Lt BKA stump infection  P:   F/U flu, viral panel  Continue ceftriaxone for serratia and group b strep in stump Will need an AKA per ortho, Again has been deferred pending overall improvement.   ENDOCRINE A:   Hypoglycemia Hx of DM type II P:   D10  > changed to d5 on 1/9 SSI  NEUROLOGIC A:   Acute metabolic encephalopathy P:   RASS goal: -1 Fentanyl gtt> minimize dose Propofol gtt Daily WUA  Family contact - Hassie Bruce >> [phone]  Independent CC time 35 minutes.  Levy Pupa, MD, PhD 07/18/2015, 9:53 AM [facility] [phone] or if no answer [phone]

## 2015-07-18 NOTE — Progress Notes (Signed)
Utilization Review Completed.  

## 2015-07-19 DIAGNOSIS — L8990 Pressure ulcer of unspecified site, unspecified stage: Secondary | ICD-10-CM | POA: Insufficient documentation

## 2015-07-19 LAB — GLUCOSE, CAPILLARY
GLUCOSE-CAPILLARY: 81 mg/dL (ref 65–99)
GLUCOSE-CAPILLARY: 87 mg/dL (ref 65–99)
Glucose-Capillary: 64 mg/dL — ABNORMAL LOW (ref 65–99)
Glucose-Capillary: 98 mg/dL (ref 65–99)
Glucose-Capillary: 86 mg/dL (ref 65–99)
Glucose-Capillary: 75 mg/dL (ref 65–99)
Glucose-Capillary: 71 mg/dL (ref 65–99)

## 2015-07-19 LAB — CBC
HCT: 24.7 % — ABNORMAL LOW (ref 39.0–52.0)
Hemoglobin: 8.3 g/dL — ABNORMAL LOW (ref 13.0–17.0)
MCH: 28.2 pg (ref 26.0–34.0)
MCHC: 33.6 g/dL (ref 30.0–36.0)
MCV: 84 fL (ref 78.0–100.0)
Platelets: 140 10*3/uL — ABNORMAL LOW (ref 150–400)
RBC: 2.94 MIL/uL — ABNORMAL LOW (ref 4.22–5.81)
RDW: 15.3 % (ref 11.5–15.5)
WBC: 8.2 10*3/uL (ref 4.0–10.5)

## 2015-07-19 LAB — BASIC METABOLIC PANEL
Anion gap: 7 (ref 5–15)
BUN: 22 mg/dL — ABNORMAL HIGH (ref 6–20)
CO2: 24 mmol/L (ref 22–32)
Calcium: 7.4 mg/dL — ABNORMAL LOW (ref 8.9–10.3)
Chloride: 108 mmol/L (ref 101–111)
Creatinine, Ser: 1.66 mg/dL — ABNORMAL HIGH (ref 0.61–1.24)
GFR calc Af Amer: 53 mL/min — ABNORMAL LOW (ref >60)
GFR calc non Af Amer: 46 mL/min — ABNORMAL LOW (ref >60)
Glucose, Bld: 70 mg/dL (ref 65–99)
Potassium: 3.2 mmol/L — ABNORMAL LOW (ref 3.5–5.1)
Sodium: 139 mmol/L (ref 135–145)

## 2015-07-19 MED ORDER — POTASSIUM CHLORIDE 20 MEQ PO PACK
40.0000 meq | PACK | Freq: Once | ORAL | Status: AC
Start: 2015-07-19 — End: 2015-07-19
  Administered 2015-07-19: 40 meq
  Filled 2015-07-19: qty 2

## 2015-07-19 MED ORDER — DEXTROSE 50 % IV SOLN
INTRAVENOUS | Status: AC
  Administered 2015-07-19: 50 mL
  Filled 2015-07-19: qty 50

## 2015-07-19 MED ORDER — DEXTROSE 50 % IV SOLN
INTRAVENOUS | Status: AC
  Administered 2015-07-19: 25 mL
  Filled 2015-07-19: qty 50

## 2015-07-19 MED ORDER — POTASSIUM CHLORIDE 20 MEQ/15ML (10%) PO SOLN
30.0000 meq | ORAL | Status: AC
  Administered 2015-07-19 (×2): 30 meq
  Filled 2015-07-19 (×2): qty 30

## 2015-07-19 MED ORDER — FUROSEMIDE 10 MG/ML IJ SOLN
40.0000 mg | Freq: Two times a day (BID) | INTRAMUSCULAR | Status: AC
  Administered 2015-07-19 (×2): 40 mg via INTRAVENOUS
  Filled 2015-07-19 (×2): qty 4

## 2015-07-19 NOTE — Progress Notes (Signed)
[facility] FOR AM LAB REPLACEMENT ONLY  The patient does apply [facility] the [facility] Electrolyte [facility] Protocol based on the criteria listed below:   1. Is GFR >/= 40 ml/min? Yes.    Patient's GFR today is 53 2. Is urine output >/= 0.5 ml/kg/hr for the last 6 hours? Yes.   Patient's UOP is 1.12 ml/kg/hr 3. Is BUN < 60 mg/dL? Yes.    Patient's BUN today is 22 4. Abnormal electrolyte(s): K - 3.2 5. Ordered repletion PER PROTOCOL 6. If a panic level lab has been reported, has the CCM MD in charge been notified? Yes.  .   Physician:  Dr. Velvet BatheMannam  Halbert Jesson C Paytan Recine 07/19/2015 5:19 AM

## 2015-07-19 NOTE — Progress Notes (Signed)
PULMONARY / CRITICAL CARE MEDICINE   Name: Victor Mcdonald MRN: [id number] DOB: 04-16-1961    ADMISSION DATE:  07/23/15  REFERRING MD:  Outside hospital ER, [facility] [place]  CHIEF COMPLAINT:  Can't breathe  SUBJECTIVE:  TF off More awake with some agitation yesterday  VITAL SIGNS: BP 112/80 mmHg  Pulse 103  Temp(Src) 96.6 F (35.9 C) (Core (Comment))  Resp 14  Ht 5\' 9"  (1.753 m)  Wt 66.633 kg (146 lb 14.4 oz)  BMI 21.68 kg/m2  SpO2 98%  VENTILATOR SETTINGS: Vent Mode:  [-] PRVC FiO2 (%):  [30 %] 30 % Set Rate:  [14 bmp] 14 bmp Vt Set:  [510 mL] 510 mL PEEP:  [5 cmH20] 5 cmH20 Pressure Support:  [5 cmH20-15 cmH20] 5 cmH20 Plateau Pressure:  [22 cmH20] 22 cmH20  INTAKE / OUTPUT: I/O last 3 completed shifts: In: 1956.2 [I.V.:1636.2; NG/GT:270; IV Piggyback:50] Out: 2270 [Urine:2150; Emesis/NG output:120]  PHYSICAL EXAMINATION: General: on vent HENT: NCAT, ETT in place PULM: Crackles bilaterally, vent supported breaths CV: RRR, no mgr GI: BS+, soft, nontender MSK: normal bulk and tone DERM: arms/legs with edema Neuro: sedated but more awake today, followed commands for RN this am  LABS:  BMET  Recent Labs Lab 07/18/15 0515 07/18/15 2030 07/19/15 0407  NA 136 138 139  K 3.4* 3.7 3.2*  CL 107 106 108  CO2 25 23 24   BUN 18 19 22*  CREATININE 1.70* 1.72* 1.66*  GLUCOSE 123* 80 70    Electrolytes  Recent Labs Lab July 23, 2015 0903  07/13/15 0344 07/14/15 0430 07/15/15 0507  07/18/15 0515 07/18/15 2030 07/19/15 0407  CALCIUM  --   < > 6.9* 7.0* 7.2*  < > 7.0* 7.7* 7.4*  MG 1.7  --  1.7 1.6* 1.8  --   --   --   --   PHOS 3.1  --  3.2 3.6  --   --   --   --   --   < > = values in this interval not displayed.  CBC  Recent Labs Lab 07/17/15 0346 07/18/15 0515 07/19/15 0407  WBC 4.6 6.5 8.2  HGB 8.7* 9.5* 8.3*  HCT 24.6* 27.1* 24.7*  PLT 79* 116* 140*    Coag's  Recent Labs Lab July 23, 2015 0903  INR 1.24    Sepsis Markers  Recent Labs Lab  2015/07/23 0903 2015-07-23 1735 07/13/15 0344 07/14/15 0430  LATICACIDVEN  --  2.1*  --   --   PROCALCITON 1.75  --  6.80 3.74    ABG  Recent Labs Lab 07/13/15 0330 07/17/15 0400  PHART 7.461* 7.471*  PCO2ART 33.4* 31.5*  PO2ART 73.2* 163*    Liver Enzymes  Recent Labs Lab 23-Jul-2015 0903 07/13/15 0344  AST 41 39  ALT 19 17  ALKPHOS 919* 740*  BILITOT 1.0 1.0  ALBUMIN <1.0* <1.0*    Cardiac Enzymes No results for input(s): TROPONINI, PROBNP in the last 168 hours.  Glucose  Recent Labs Lab 07/18/15 1236 07/18/15 1558 07/18/15 1932 07/19/15 0003 07/19/15 0353 07/19/15 0443  GLUCAP 89 77 93 81 64* 98    Imaging 1/8 CXR > improved aeration bilaterally, ETT, CVL in place  STUDIES:  1/04 CT head >> negative 1/04 Abd u/s >> small ascites, normal CBG, no focal hepatic mass, absent GB 1/6 Echo> LVEF 55-60%, LA mildly dilated, no pFO, RVSP 39, small to mod effusion  CULTURES: 1/04 Blood cx (OSH) >> 1/04 Sputum >> serratia >> pan-sens.  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 1/8 CXR improved but likely due to increased PEEP, may be pulmonary edema based on exam.  ASSESSMENT / PLAN:  PULMONARY A: Acute hypoxic respiratory failure> ARDS vs pulm edema ETT high P:   Wean PEEP/FiO2 for goal PaO2 55-65 Follow ABG TVol 8cc/kg F/u CXR PRN albuterol Dosing lasix daily > repeat 1/10  CARDIOVASCULAR A:  Hx of HLD, HTN, chronic diastolic CHF P:  PRN labetalol,  hydralazine for SBP > 170  RENAL A:   Hypokalemia AKI but volume up, FENA 1.2% P:   Monitor BMET and UOP Replace electrolytes as needed Lasix again today 1/11  GASTROINTESTINAL A:   ?Upper GI bleed at presentation Hx of Hep C Hx of pancreatitis S/p Whipple procedure in 2011 Severe protein calorie malnutrition Ileus> likely narcotic related P:   Tube feeds > retry trickle on 1/11 after on reglan Minimize fentanyl Protonix  GI with no plan for EGD at this time pending stabilization of other issues.  PPI to intermittent   HEMATOLOGIC A:   Acute blood loss anemia from reported upper GI bleed P:  F/u CBC Transfuse for Hb < 7 or bleeding SCD's for DVT prevention  INFECTIOUS A:   HCAP  Lt BKA stump infection  P:   F/U flu, viral panel  Continue ceftriaxone for serratia and group b strep in stump Will need an AKA per ortho, Again has been deferred pending overall improvement.   ENDOCRINE A:   Hypoglycemia Hx of DM type II P:   D10  > changed to d5 on 1/9 SSI  NEUROLOGIC A:   Acute metabolic encephalopathy P:   RASS goal: -1 Fentanyl gtt> minimize dose Propofol gtt off Daily WUA  Family contact - Hassie BruceBrother, Michael >> [phone]  Independent CC time 35 minutes.  Levy Pupaobert Myeshia Fojtik, MD, PhD 07/19/2015, 7:59 AM [facility] [phone] or if no answer [phone]

## 2015-07-19 NOTE — Progress Notes (Signed)
Hypoglycemic Event  CBG: 64  Treatment: D50 IV 25 mL  Symptoms: None  Follow-up CBG: Time:0440 CBG Result:98  Possible Reasons for Event: Unknown  Comments/MD notified:hypoglycemia protocol followed, will notify rounding MD    Carlos LeveringMcAtee, Dandre Sisler R

## 2015-07-20 LAB — GLUCOSE, CAPILLARY
GLUCOSE-CAPILLARY: 100 mg/dL — AB (ref 65–99)
GLUCOSE-CAPILLARY: 72 mg/dL (ref 65–99)
GLUCOSE-CAPILLARY: 93 mg/dL (ref 65–99)
GLUCOSE-CAPILLARY: 97 mg/dL (ref 65–99)
GLUCOSE-CAPILLARY: 97 mg/dL (ref 65–99)
GLUCOSE-CAPILLARY: 99 mg/dL (ref 65–99)

## 2015-07-20 LAB — BASIC METABOLIC PANEL
Anion gap: 5 (ref 5–15)
BUN: 22 mg/dL — ABNORMAL HIGH (ref 6–20)
CALCIUM: 7.5 mg/dL — AB (ref 8.9–10.3)
CO2: 24 mmol/L (ref 22–32)
CREATININE: 1.58 mg/dL — AB (ref 0.61–1.24)
Chloride: 111 mmol/L (ref 101–111)
GFR, EST AFRICAN AMERICAN: 56 mL/min — AB (ref >60)
GFR, EST NON AFRICAN AMERICAN: 48 mL/min — AB (ref >60)
GLUCOSE: 99 mg/dL (ref 65–99)
Potassium: 3.7 mmol/L (ref 3.5–5.1)
Sodium: 140 mmol/L (ref 135–145)

## 2015-07-20 LAB — C DIFFICILE QUICK SCREEN W PCR REFLEX
C DIFFICILE (CDIFF) TOXIN: NEGATIVE
C DIFFICLE (CDIFF) ANTIGEN: NEGATIVE
C Diff interpretation: NEGATIVE

## 2015-07-20 MED ORDER — VITAL AF 1.2 CAL PO LIQD
1000.0000 mL | ORAL | Status: DC
  Administered 2015-07-20: 1000 mL

## 2015-07-20 MED ORDER — DEXTROSE 50 % IV SOLN
25.0000 g | Freq: Once | INTRAVENOUS | Status: AC
  Administered 2015-07-20: 25 g via INTRAVENOUS

## 2015-07-20 MED ORDER — HYDRALAZINE HCL 50 MG PO TABS
100.0000 mg | ORAL_TABLET | Freq: Four times a day (QID) | ORAL | Status: DC
  Administered 2015-07-20 – 2015-07-24 (×15): 100 mg
  Filled 2015-07-20 (×21): qty 2

## 2015-07-20 MED ORDER — SODIUM CHLORIDE 0.9 % IV BOLUS (SEPSIS)
1000.0000 mL | Freq: Once | INTRAVENOUS | Status: AC
  Administered 2015-07-20: 1000 mL via INTRAVENOUS

## 2015-07-20 MED ORDER — HYDRALAZINE HCL 20 MG/ML IJ SOLN
20.0000 mg | Freq: Once | INTRAMUSCULAR | Status: AC
  Administered 2015-07-20: 20 mg via INTRAVENOUS
  Filled 2015-07-20: qty 1

## 2015-07-20 NOTE — Progress Notes (Signed)
PULMONARY / CRITICAL CARE MEDICINE   Name: Victor CornfieldRobert Basford MRN: [id number] DOB: 1960-11-21    ADMISSION DATE:  07/19/2015  REFERRING MD[facility]  [facility] [place]  CHIEF COMPLAINT:  Can't breathe  SUBJECTIVE:  TF remain off fentantyl off this am but he remains apneic on SBT thus far Hypertension treated with hydralazine overnight   VITAL SIGNS: BP 179/82 mmHg  Pulse 44  Temp(Src) 93 F (33.9 C) (Oral)  Resp 14  Ht 5\' 9"  (1.753 m)  Wt 66.633 kg (146 lb 14.4 oz)  BMI 21.68 kg/m2  SpO2 100%  VENTILATOR SETTINGS: Vent Mode:  [-] PRVC FiO2 (%):  [30 %] 30 % Set Rate:  [14 bmp] 14 bmp Vt Set:  [510 mL] 510 mL PEEP:  [5 cmH20] 5 cmH20 Pressure Support:  [5 cmH20] 5 cmH20 Plateau Pressure:  [21 cmH20-23 cmH20] 21 cmH20  INTAKE / OUTPUT: I/O last 3 completed shifts: In: 1937.3 [I.V.:1497.3; NG/GT:390; IV Piggyback:50] Out: 2590 [Urine:2590]  PHYSICAL EXAMINATION: General: on vent HENT: NCAT, ETT in place PULM: Crackles bilaterally, vent supported breaths CV: RRR, no mgr GI: BS+, soft, nontender MSK: normal bulk and tone DERM: arms/legs with edema Neuro: sedated but more awake today, followed commands for RN this am  LABS:  BMET  Recent Labs Lab 07/18/15 2030 07/19/15 0407 07/20/15 0340  NA 138 139 140  K 3.7 3.2* 3.7  CL 106 108 111  CO2 23 24 24   BUN 19 22* 22*  CREATININE 1.72* 1.66* 1.58*  GLUCOSE 80 70 99    Electrolytes  Recent Labs Lab 07/14/15 0430 07/15/15 0507  07/18/15 2030 07/19/15 0407 07/20/15 0340  CALCIUM 7.0* 7.2*  < > 7.7* 7.4* 7.5*  MG 1.6* 1.8  --   --   --   --   PHOS 3.6  --   --   --   --   --   < > = values in this interval not displayed.  CBC  Recent Labs Lab 07/17/15 0346 07/18/15 0515 07/19/15 0407  WBC 4.6 6.5 8.2  HGB 8.7* 9.5* 8.3*  HCT 24.6* 27.1* 24.7*  PLT 79* 116* 140*    Coag's No results for input(s): APTT, INR in the last 168 hours.  Sepsis Markers  Recent Labs Lab 07/14/15 0430   PROCALCITON 3.74    ABG  Recent Labs Lab 07/17/15 0400  PHART 7.471*  PCO2ART 31.5*  PO2ART 163*    Liver Enzymes No results for input(s): AST, ALT, ALKPHOS, BILITOT, ALBUMIN in the last 168 hours.  Cardiac Enzymes No results for input(s): TROPONINI, PROBNP in the last 168 hours.  Glucose  Recent Labs Lab 07/19/15 1302 07/19/15 1710 07/19/15 1932 07/19/15 2350 07/20/15 0400 07/20/15 0801  GLUCAP 86 75 71 72 93 97    Imaging 1/8 CXR > improved aeration bilaterally, ETT, CVL in place  STUDIES:  1/04 CT head >> negative 1/04 Abd u/s >> small ascites, normal CBG, no focal hepatic mass, absent GB 1/6 Echo> LVEF 55-60%, LA mildly dilated, no pFO, RVSP 39, small to mod effusion  CULTURES: 1/04 Blood cx (OSH) >> 1/04 Sputum >> serratia >> pan-sens.  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   ASSESSMENT / PLAN:  PULMONARY A: Acute hypoxic respiratory failure> ARDS vs pulm edema P:   Wean PEEP/FiO2 for goal PaO2 55-65 Follow ABG TVol 8cc/kg F/u CXR PRN albuterol Dosing lasix daily > repeat 1/12  CARDIOVASCULAR A:  Hx of HLD, HTN, chronic diastolic CHF Sinus bradycardia P:  PRN labetalol, hydralazine for SBP > 170 Increase scheduled hydralazine 1/12 Repeat ECG 1/12  RENAL A:   Hypokalemia AKI but volume up, FENA 1.2% P:   Monitor BMET and UOP Replace electrolytes as needed Lasix again today 1/12  GASTROINTESTINAL A:   ?Upper GI bleed at  presentation Hx of Hep C Hx of pancreatitis S/p Whipple procedure in 2011 Severe protein calorie malnutrition Ileus> likely narcotic related P:   Tube feeds > retry trickle on 1/12, on reglan Minimize fentanyl Protonix  GI with no plan for EGD at this time pending stabilization of other issues.  PPI to intermittent   HEMATOLOGIC A:   Acute blood loss anemia from reported upper GI bleed P:  F/u CBC Transfuse for Hb < 7 or bleeding SCD's for DVT prevention  INFECTIOUS A:   HCAP  RVP negative Lt BKA stump infection  P:   Continue ceftriaxone for serratia and group b strep in stump; day 9 of 10 on 1/12 Will need an AKA per ortho, Again has been deferred pending overall improvement.   ENDOCRINE A:   Hypoglycemia Hx of DM type II P:   D10  > changed to d5 on 1/9 SSI  NEUROLOGIC A:   Acute metabolic encephalopathy P:   RASS goal: -1 Fentanyl gtt> minimize dose Propofol gtt off Daily WUA  Family contact - Hassie Bruce >> [phone]  Independent CC time 35 minutes.  Levy Pupa, MD, PhD 07/20/2015, 9:51 AM [facility] [phone] or if no answer [phone]

## 2015-07-20 NOTE — Progress Notes (Signed)
Pts urine output has been decreased 15cc in 2 hours. [facility] Was given an 1000cc bolus. Will continue to monitor.

## 2015-07-20 NOTE — Progress Notes (Signed)
eLink Physician-Brief Progress Note Patient Name: Victor CornfieldRobert Mcdonald DOB: [birth year] MRN: [id number]   Date of Service  07/20/2015  HPI/Events of Note  Low gluc=72  eICU Interventions  1amp D50, recheck      Intervention Category Major Interventions: Other:  Nathaneal Sommers 07/20/2015, 12:37 AM

## 2015-07-20 NOTE — Progress Notes (Signed)
eLink Physician-Brief Progress Note Patient Name: Victor CornfieldRobert Mcdonald DOB: 1960/09/13 MRN: [id number]   Date of Service  07/20/2015  HPI/Events of Note  Oliguria - CVP = 6.  eICU Interventions  Will bolus with 0.9 NaCl 1 liter IV over 1 hour now.      Intervention Category Intermediate Interventions: Oliguria - evaluation and management  Lenell AntuSommer,Natalina Wieting Eugene 07/20/2015, 5:33 PM

## 2015-07-20 NOTE — Progress Notes (Signed)
eLink Physician-Brief Progress Note Patient Name: Kathreen CornfieldRobert Corea DOB: 1961-01-26 MRN: [id number]   Date of Service  07/20/2015  HPI/Events of Note  Oliguria - CVP remains = 6.  eICU Interventions  Will bolus with 0.9 NaCl 1 liter IV over 1 hour now.      Intervention Category Intermediate Interventions: Oliguria - evaluation and management  Lenell AntuSommer,Chizaram Latino Eugene 07/20/2015, 9:31 PM

## 2015-07-20 NOTE — Progress Notes (Signed)
eLink Physician-Brief Progress Note Patient Name: Victor CornfieldRobert Mcdonald DOB: 1961/06/10 MRN: [id number]   Date of Service  07/20/2015  HPI/Events of Note  sbp in 180s  eICU Interventions  Hydralazine 20mg  IV x 1     Intervention Category Major Interventions: Hypertension - evaluation and management  Victor Mcdonald 07/20/2015, 5:41 AM

## 2015-07-21 ENCOUNTER — Inpatient Hospital Stay (HOSPITAL_COMMUNITY): Payer: Medicare Other

## 2015-07-21 DIAGNOSIS — R569 Unspecified convulsions: Secondary | ICD-10-CM | POA: Insufficient documentation

## 2015-07-21 LAB — GLUCOSE, CAPILLARY
GLUCOSE-CAPILLARY: 114 mg/dL — AB (ref 65–99)
GLUCOSE-CAPILLARY: 120 mg/dL — AB (ref 65–99)
GLUCOSE-CAPILLARY: 156 mg/dL — AB (ref 65–99)
Glucose-Capillary: 125 mg/dL — ABNORMAL HIGH (ref 65–99)
Glucose-Capillary: 118 mg/dL — ABNORMAL HIGH (ref 65–99)

## 2015-07-21 LAB — BASIC METABOLIC PANEL
Anion gap: 9 (ref 5–15)
BUN: 26 mg/dL — ABNORMAL HIGH (ref 6–20)
CALCIUM: 7.6 mg/dL — AB (ref 8.9–10.3)
CO2: 18 mmol/L — AB (ref 22–32)
CREATININE: 1.82 mg/dL — AB (ref 0.61–1.24)
Chloride: 114 mmol/L — ABNORMAL HIGH (ref 101–111)
GFR calc non Af Amer: 41 mL/min — ABNORMAL LOW (ref >60)
GFR, EST AFRICAN AMERICAN: 47 mL/min — AB (ref >60)
Glucose, Bld: 118 mg/dL — ABNORMAL HIGH (ref 65–99)
Potassium: 2.6 mmol/L — CL (ref 3.5–5.1)
SODIUM: 141 mmol/L (ref 135–145)

## 2015-07-21 LAB — CBC
HCT: 35.4 % — ABNORMAL LOW (ref 39.0–52.0)
Hemoglobin: 12.3 g/dL — ABNORMAL LOW (ref 13.0–17.0)
MCH: 29.3 pg (ref 26.0–34.0)
MCHC: 34.7 g/dL (ref 30.0–36.0)
MCV: 84.3 fL (ref 78.0–100.0)
PLATELETS: 223 10*3/uL (ref 150–400)
RBC: 4.2 MIL/uL — AB (ref 4.22–5.81)
RDW: 15.6 % — ABNORMAL HIGH (ref 11.5–15.5)
WBC: 12.5 10*3/uL — AB (ref 4.0–10.5)

## 2015-07-21 MED ORDER — LEVETIRACETAM 500 MG/5ML IV SOLN
500.0000 mg | Freq: Two times a day (BID) | INTRAVENOUS | Status: DC
  Administered 2015-07-21 – 2015-07-26 (×12): 500 mg via INTRAVENOUS
  Filled 2015-07-21 (×15): qty 5

## 2015-07-21 MED ORDER — LORAZEPAM 2 MG/ML IJ SOLN
INTRAMUSCULAR | Status: AC
  Filled 2015-07-21: qty 1

## 2015-07-21 MED ORDER — PANTOPRAZOLE SODIUM 40 MG IV SOLR
40.0000 mg | Freq: Every day | INTRAVENOUS | Status: DC
  Administered 2015-07-21 – 2015-07-26 (×6): 40 mg via INTRAVENOUS
  Filled 2015-07-21 (×8): qty 40

## 2015-07-21 MED ORDER — VITAL AF 1.2 CAL PO LIQD: 1000.0000 mL | ORAL | Status: DC

## 2015-07-21 MED ORDER — VITAL HIGH PROTEIN PO LIQD: 1000.0000 mL | ORAL | Status: DC

## 2015-07-21 MED ORDER — VITAL AF 1.2 CAL PO LIQD
1000.0000 mL | ORAL | Status: DC
  Administered 2015-07-21 – 2015-07-24 (×2): 1000 mL

## 2015-07-21 MED ORDER — SODIUM CHLORIDE 0.9 % IV BOLUS (SEPSIS)
1000.0000 mL | Freq: Once | INTRAVENOUS | Status: AC
  Administered 2015-07-21: 1000 mL via INTRAVENOUS

## 2015-07-21 MED ORDER — POTASSIUM CHLORIDE 10 MEQ/50ML IV SOLN
10.0000 meq | INTRAVENOUS | Status: AC
  Administered 2015-07-21 (×6): 10 meq via INTRAVENOUS
  Filled 2015-07-21 (×6): qty 50

## 2015-07-21 MED ORDER — LORAZEPAM 2 MG/ML IJ SOLN
2.0000 mg | INTRAMUSCULAR | Status: DC | PRN
  Administered 2015-07-21 – 2015-07-23 (×2): 2 mg via INTRAVENOUS
  Filled 2015-07-21: qty 1

## 2015-07-21 NOTE — Progress Notes (Signed)
eLink Physician-Brief Progress Note Patient Name: Victor CornfieldRobert Mcdonald DOB: April 05, 1961 MRN: [id number]   Date of Service  07/21/2015  HPI/Events of Note  Hypokalemia  eICU Interventions  Potassium replaced     Intervention Category Minor Interventions: Electrolytes abnormality - evaluation and management  DETERDING,ELIZABETH 07/21/2015, 6:44 AM

## 2015-07-21 NOTE — Progress Notes (Signed)
This nurse upon entering the room noted what appeared to be seizure like activity that lasted for approximately 3 minutes. By the time [facility] cameraed in the activity had ended. Pt had previously been intermittently following commands but was not immediately after the seizure like activity. This nurse will continue to monitor closely. Modena JanskyKevin Ernst Cumpston RN [facility]

## 2015-07-21 NOTE — Progress Notes (Addendum)
Pt HR in the 130s. Notified [facility], no new orders at this time. WIll continue to monitor closely.

## 2015-07-21 NOTE — Progress Notes (Signed)
PULMONARY / CRITICAL CARE MEDICINE   Name: Victor CornfieldRobert Mcdonald MRN: [id number] DOB: 11-06-1960    ADMISSION DATE:  07/13/2015  REFERRING MD:  Outside hospital ER, [facility] [place]  CHIEF COMPLAINT:  Can't breathe  SUBJECTIVE:  Possible seizure activity reported this am > about 4 minutes tonic/clonic movement   VITAL SIGNS: BP 169/108 mmHg  Pulse 99  Temp(Src) 98.3 F (36.8 C) (Oral)  Resp 27  Ht 5\' 9"  (1.753 m)  Wt 66.633 kg (146 lb 14.4 oz)  BMI 21.68 kg/m2  SpO2 100%  VENTILATOR SETTINGS: Vent Mode:  [-] PRVC FiO2 (%):  [30 %] 30 % Set Rate:  [14 bmp] 14 bmp Vt Set:  [510 mL] 510 mL PEEP:  [5 cmH20] 5 cmH20 Plateau Pressure:  [18 cmH20-21 cmH20] 18 cmH20  INTAKE / OUTPUT: I/O last 3 completed shifts: In: 3609.6 [I.V.:1197.3; NG/GT:362.3; IV Piggyback:2050] Out: 2625 [Urine:1425; Stool:1200]  PHYSICAL EXAMINATION: General: on vent HENT: NCAT, ETT in place PULM: Crackles bilaterally, vent supported breaths CV: RRR, no mgr GI: BS+, soft, nontender MSK: normal bulk and tone DERM: arms/legs with edema Neuro: sedated but more awake today, followed commands for RN this am  LABS:  BMET  Recent Labs Lab 07/19/15 0407 07/20/15 0340 07/21/15 0430  NA 139 140 141  K 3.2* 3.7 2.6*  CL 108 111 114*  CO2 24 24 18*  BUN 22* 22* 26*  CREATININE 1.66* 1.58* 1.82*  GLUCOSE 70 99 118*    Electrolytes  Recent Labs Lab 07/15/15 0507  07/19/15 0407 07/20/15 0340 07/21/15 0430  CALCIUM 7.2*  < > 7.4* 7.5* 7.6*  MG 1.8  --   --   --   --   < > = values in this interval not displayed.  CBC  Recent Labs Lab 07/18/15 0515 07/19/15 0407 07/21/15 0430  WBC 6.5 8.2 12.5*  HGB 9.5* 8.3* 12.3*  HCT 27.1* 24.7* 35.4*  PLT 116* 140* 223    Coag's No results for input(s): APTT, INR in the last 168 hours.  Sepsis Markers No results for input(s): LATICACIDVEN, PROCALCITON, O2SATVEN in the last 168 hours.  ABG  Recent Labs Lab 07/17/15 0400  PHART 7.471*  PCO2ART  31.5*  PO2ART 163*    Liver Enzymes No results for input(s): AST, ALT, ALKPHOS, BILITOT, ALBUMIN in the last 168 hours.  Cardiac Enzymes No results for input(s): TROPONINI, PROBNP in the last 168 hours.  Glucose  Recent Labs Lab 07/20/15 0801 07/20/15 1209 07/20/15 1542 07/20/15 1926 07/20/15 2345 07/21/15 0351  GLUCAP 97 97 99 100* 114* 125*    Imaging 1/8 CXR > improved aeration bilaterally, ETT, CVL in place  STUDIES:  1/04 CT head >> negative 1/04 Abd u/s >> small ascites, normal CBG, no focal hepatic mass, absent GB 1/6 Echo> LVEF 55-60%, LA mildly dilated, no pFO, RVSP 39, small to mod effusion Head CT 1/13 >> No acute bleed or other change.   CULTURES: 1/04 Blood cx (OSH) >> 1/04 Sputum >> serratia >> pan-sens.  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   ASSESSMENT / PLAN:  PULMONARY A: Acute hypoxic respiratory failure> ARDS vs pulm edema P:   Wean PEEP/FiO2 for goal PaO2 55-65 Follow ABG TVol 8cc/kg F/u CXR PRN albuterol Dosing lasix daily > defer 1/13 as CVP reaching goal   CARDIOVASCULAR A:  Hx of HLD, HTN, chronic diastolic CHF Sinus bradycardia P:  PRN labetalol, hydralazine for SBP > 170 Increase scheduled hydralazine 1/12 Repeat ECG 1/12  RENAL A:   Hypokalemia AKI but volume up, FENA 1.2% P:   Monitor BMET and UOP Replace electrolytes as needed Hold lasix 1/13  GASTROINTESTINAL A:   ?Upper GI bleed at presentation Hx of Hep  C Hx of pancreatitis S/p Whipple procedure in 2011 Severe protein calorie malnutrition Ileus> likely narcotic related P:   Tube feeds > try to advance on 1/13, place post-pyloric tube Minimize fentanyl Protonix  GI with no plan for EGD at this time pending stabilization of other issues.  PPI to intermittent   HEMATOLOGIC A:   Acute blood loss anemia from reported upper GI bleed P:  F/u CBC Transfuse for Hb < 7 or bleeding SCD's for DVT prevention  INFECTIOUS A:   HCAP  RVP negative Lt BKA stump infection  P:   Continue ceftriaxone for serratia and group b strep in stump; day 10 of 10 on 1/13, stop 1/14 Will need an AKA per ortho, Again has been deferred pending overall improvement.   ENDOCRINE A:   Hypoglycemia Hx of DM type II P:   D10  > changed to d5 on 1/9 SSI  NEUROLOGIC A:   Acute metabolic encephalopathy Possible seizure activity 1/13 am ? Hx EtOH, at risk withdrawal P:   RASS goal: -1 Fentanyl gtt> minimize dose Propofol gtt off Completed folate and thiamine Daily WUA EEG today Keppra loaded > will continue for now Will consult neuro if any further episodes that suggest seizure activity  Family contact - Hassie Bruce >> [phone]  Independent CC time 35 minutes.  Levy Pupa, MD, PhD 07/21/2015, 9:19 AM [facility] [phone] or if no answer [phone]

## 2015-07-21 NOTE — Progress Notes (Signed)
Notified ELINK no movement noted with painful stimuli. Urine output has only been 20cc in 8 hours. No new orders at this time. Will continue to monitor closely.

## 2015-07-21 NOTE — Progress Notes (Signed)
Elink notified of continued low urine output of 5-1510ml per hour. Orders received to bladder scan and irrigate foley to check for obstruction. Will carry out and continue to monitor closely. Modena JanskyKevin Dotty Gonzalo RN [facility]

## 2015-07-21 NOTE — Progress Notes (Signed)
eLink Physician-Brief Progress Note Patient Name: Victor CornfieldRobert Mcdonald DOB: 02-02-1961 MRN: [id number]   Date of Service  07/21/2015  HPI/Events of Note  HR = 125 - Sinus Tachycardia. CVP = 5.   eICU Interventions  Will bolus with 0.9 NaCl 1 liter IV over 1 hour now.      Intervention Category Major Interventions: Arrhythmia - evaluation and management  Shatara Stanek Dennard Nipugene 07/21/2015, 4:47 PM

## 2015-07-21 NOTE — Progress Notes (Addendum)
E-Link  MD notified of low uop per hour (175ml/hr) after further irrigating foley and bladder scanning, as well as current CVP of 5. No orders received. Will continue to closely monitor. Modena JanskyKevin Kyndal Gloster RN 2 [facility]

## 2015-07-21 NOTE — Progress Notes (Signed)
CRITICAL VALUE ALERT  Critical value received: K 2.6  Date of notification:  07/21/2015  Time of notification:  0620  Critical value read back:Yes.    Nurse who received alert:  Modena JanskyKevin Geovonni Meyerhoff RN  MD notified (1st page):  Pola CornELink RN  Time of first page:  [phone]  MD notified (2nd page):  Time of second page:  Responding MD:  E-Link RN  Time MD responded:  [phone]

## 2015-07-21 NOTE — Progress Notes (Signed)
Elink Notified of critical K of 2.6, creat of 1.8, CVP of 4, and stool output of 1200 ml overnight. No orders at this time. Will continue to monitor closely. Modena JanskyKevin Alanii Ramer RN 2 [facility]

## 2015-07-21 NOTE — Progress Notes (Signed)
Elink notified of low uop of 5ml per hour. Orders received for 1Liter NS bolus. Will continue to monitor. Modena JanskyKevin Luddie Boghosian RN 2 [facility]

## 2015-07-21 NOTE — Progress Notes (Signed)
Utilization Review Completed.  

## 2015-07-21 NOTE — Progress Notes (Signed)
eLink Physician-Brief Progress Note Patient Name: Victor CornfieldRobert Milliman DOB: 02-May-1961 MRN: [id number]   Date of Service  07/21/2015  HPI/Events of Note  Call from nurse reporting onset tonic clonic seizure activity.  Based on h/o we have there is no PMH of seizures.   On camera check the patient is post-itcal  eICU Interventions  Plan: Keppra IV PRN ativan order Non-contrast head CT To be evaluated by bedside by PCCM Consider Neuro consult     Intervention Category Major Interventions: Seizures - evaluation and management  DETERDING,ELIZABETH 07/21/2015, 6:56 AM

## 2015-07-21 NOTE — Progress Notes (Signed)
Bedside EEG completed, results pending. 

## 2015-07-21 NOTE — Procedures (Signed)
ELECTROENCEPHALOGRAM REPORT  Date of Study: 07/21/2015  Patient's Name: Victor CornfieldRobert Mcdonald MRN: [id number] Date of Birth: 1961-01-16  Referring Provider: Dr. Levy Pupaobert Byrum  Clinical History: This is a 55 year old man with possible seizure this morning with tonic-clonic movement reported. Received IV Ativan.  Medications: levETIRAcetam (KEPPRA) 500 mg in sodium chloride 0.9 % 100 mL IVPB albuterol (PROVENTIL) (2.5 MG/3ML) 0.083% nebulizer solution 2.5 mg cloNIDine (CATAPRES - Dosed in mg/24 hr) patch 0.1 mg hydrALAZINE (APRESOLINE) tablet 100 mg labetalol (NORMODYNE,TRANDATE) injection 10 mg metoCLOPramide (REGLAN) injection 5 mg pantoprazole (PROTONIX) injection 40 mg  Technical Summary: A multichannel digital EEG recording measured by the international 10-20 system with electrodes applied with paste and impedances below 5000 ohms performed as portable with EKG monitoring in an intubated and unresponsive patient.  Hyperventilation and photic stimulation were not performed.  The digital EEG was referentially recorded, reformatted, and digitally filtered in a variety of bipolar and referential montages for optimal display.   Description: The patient is intubated and unresponsive during the recording. No sedating medications listed. There is no clear posterior dominant rhythm. The background consists of a large amount of diffuse low voltage theta and delta slowing. Poorly formed low voltage vertex waves are occasionally seen. With noxious stimulation, there is no clear reactivity noted. Hyperventilation and photic stimulation were not performed.  There were no epileptiform discharges or electrographic seizures seen.    EKG lead was unremarkable.  Impression: This EEG is abnormal due to moderate diffuse slowing of the background.  Clinical Correlation of the above findings indicates diffuse cerebral dysfunction that is non-specific in etiology and can be seen with hypoxic/ischemic injury,  toxic/metabolic encephalopathies, or medication effect.  The absence of epileptiform discharges does not rule out a clinical diagnosis of epilepsy.  Clinical correlation is advised.   Patrcia DollyKaren Jori Frerichs, M.D.

## 2015-07-21 NOTE — Progress Notes (Signed)
Patient was transported from 2S to 2M14 without any complications.  Will continue to monitor.

## 2015-07-21 NOTE — Progress Notes (Signed)
Nutrition Follow Up  DOCUMENTATION CODES:   Non-severe (moderate) malnutrition in context of chronic illness  INTERVENTION:    Once small bore feeding tube placed per CORTRAK team, re-start Vital AF 1.2 formula at 20 ml/hr and increase by 10 ml every 12 hours to goal rate of 60 ml/hr  TF regimen to provide 1728 kcals, 108 gm protein, 1168 ml of free water  NUTRITION DIAGNOSIS:   Inadequate oral intake related to inability to eat as evidenced by NPO status, ongoing  GOAL:   Patient will meet greater than or equal to 90% of their needs, progressing  MONITOR:   TF tolerance, Vent status, Labs, Weight trends, Skin, I & O's  ASSESSMENT:   55 yo Male was brought to ER. He was hypoxic. He required intubation. Also noted to have hematemesis. Reported that stool occult blood was negative. He was anemic and given PRBC transfusion. He was noted to be hypoglycemic. His CXR showed infiltrates. He had blood cx's done at outside hospital, and given vancomycin, zosyn. He had CT head that was negative. [facility]  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  Patient is currently intubated on ventilator support  MV: 13.2 L/min Temp (24hrs), Avg:97.8 F (36.6 C), Min:97.2 F (36.2 C), Max:98.3 F (36.8 C)   Propofol discontinued.  Vital AF 1.2 formula currently infusing at 10 ml/hr via OGT.  Reglan started 1/9.  Pt with continued TF intolerance.  Spoke with Dr. Delton CoombesByrum with [facility].  Plan is to place post pyloric small bore feeding tube.  CORTRAK team notified.  Nutrition-Focused physical exam completed. Findings are moderate fat depletion, moderate muscle depletion, and mild/moderate edema.   Patient at risk for refeeding syndrome given malnutrition.  Will advance TF slowly.  Diet Order:  Diet NPO time specified  Skin:  Wound (see comment) (L stump wound)  Last BM:  1/13  Height:   Ht Readings from Last 1 Encounters:  08/08/2015 5\' 9"   (1.753 m)    Weight:   Wt Readings from Last 1 Encounters:  07/15/15 146 lb 14.4 oz (66.633 kg)    Ideal Body Weight:  73 kg  BMI:  Body mass index is 21.68 kg/(m^2).  Estimated Nutritional Needs:   Kcal:  1780  Protein:  95-105 gm  Fluid:  per MD  EDUCATION NEEDS:   No education needs identified at this time  Maureen ChattersKatie Fletcher Ostermiller, RD, LDN Pager #: [phone] After-Hours Pager #: [phone]

## 2015-07-22 ENCOUNTER — Inpatient Hospital Stay (HOSPITAL_COMMUNITY): Payer: Medicare Other

## 2015-07-22 LAB — BASIC METABOLIC PANEL
ANION GAP: 9 (ref 5–15)
Anion gap: 7 (ref 5–15)
BUN: 34 mg/dL — ABNORMAL HIGH (ref 6–20)
BUN: 37 mg/dL — ABNORMAL HIGH (ref 6–20)
CALCIUM: 7.5 mg/dL — AB (ref 8.9–10.3)
CALCIUM: 7.3 mg/dL — AB (ref 8.9–10.3)
CO2: 16 mmol/L — ABNORMAL LOW (ref 22–32)
CO2: 16 mmol/L — AB (ref 22–32)
CREATININE: 1.97 mg/dL — AB (ref 0.61–1.24)
CREATININE: 2 mg/dL — AB (ref 0.61–1.24)
Chloride: 119 mmol/L — ABNORMAL HIGH (ref 101–111)
Chloride: 120 mmol/L — ABNORMAL HIGH (ref 101–111)
GFR calc non Af Amer: 36 mL/min — ABNORMAL LOW (ref >60)
GFR, EST AFRICAN AMERICAN: 43 mL/min — AB (ref >60)
GFR, EST AFRICAN AMERICAN: 42 mL/min — AB (ref >60)
GFR, EST NON AFRICAN AMERICAN: 37 mL/min — AB (ref >60)
GLUCOSE: 119 mg/dL — AB (ref 65–99)
GLUCOSE: 79 mg/dL (ref 65–99)
Potassium: 2.2 mmol/L — CL (ref 3.5–5.1)
Potassium: 2.3 mmol/L — CL (ref 3.5–5.1)
Sodium: 144 mmol/L (ref 135–145)
Sodium: 143 mmol/L (ref 135–145)

## 2015-07-22 LAB — CBC
HCT: 28.7 % — ABNORMAL LOW (ref 39.0–52.0)
HEMOGLOBIN: 10 g/dL — AB (ref 13.0–17.0)
MCH: 29.2 pg (ref 26.0–34.0)
MCHC: 34.8 g/dL (ref 30.0–36.0)
MCV: 83.9 fL (ref 78.0–100.0)
PLATELETS: 190 10*3/uL (ref 150–400)
RBC: 3.42 MIL/uL — AB (ref 4.22–5.81)
RDW: 15.8 % — ABNORMAL HIGH (ref 11.5–15.5)
WBC: 13.7 10*3/uL — ABNORMAL HIGH (ref 4.0–10.5)

## 2015-07-22 LAB — MAGNESIUM: MAGNESIUM: 1.7 mg/dL (ref 1.7–2.4)

## 2015-07-22 LAB — GLUCOSE, CAPILLARY
GLUCOSE-CAPILLARY: 114 mg/dL — AB (ref 65–99)
GLUCOSE-CAPILLARY: 114 mg/dL — AB (ref 65–99)
GLUCOSE-CAPILLARY: 161 mg/dL — AB (ref 65–99)
GLUCOSE-CAPILLARY: 40 mg/dL — AB (ref 65–99)
Glucose-Capillary: 118 mg/dL — ABNORMAL HIGH (ref 65–99)
Glucose-Capillary: 126 mg/dL — ABNORMAL HIGH (ref 65–99)
Glucose-Capillary: 45 mg/dL — ABNORMAL LOW (ref 65–99)
Glucose-Capillary: 55 mg/dL — ABNORMAL LOW (ref 65–99)
Glucose-Capillary: 71 mg/dL (ref 65–99)
Glucose-Capillary: 72 mg/dL (ref 65–99)
Glucose-Capillary: 76 mg/dL (ref 65–99)

## 2015-07-22 LAB — PHOSPHORUS: Phosphorus: 4.5 mg/dL (ref 2.5–4.6)

## 2015-07-22 MED ORDER — DEXTROSE 50 % IV SOLN
INTRAVENOUS | Status: AC
  Administered 2015-07-22: 50 mL
  Filled 2015-07-22: qty 50

## 2015-07-22 MED ORDER — DEXTROSE 10 % IV SOLN
INTRAVENOUS | Status: DC
  Administered 2015-07-22 – 2015-07-25 (×5): via INTRAVENOUS

## 2015-07-22 MED ORDER — POTASSIUM CHLORIDE 10 MEQ/50ML IV SOLN
10.0000 meq | INTRAVENOUS | Status: AC
  Administered 2015-07-22 – 2015-07-23 (×6): 10 meq via INTRAVENOUS
  Filled 2015-07-22 (×6): qty 50

## 2015-07-22 MED ORDER — ONDANSETRON HCL 4 MG/2ML IJ SOLN
4.0000 mg | Freq: Four times a day (QID) | INTRAMUSCULAR | Status: DC | PRN
  Administered 2015-07-22: 4 mg via INTRAVENOUS
  Filled 2015-07-22: qty 2

## 2015-07-22 MED ORDER — DEXTROSE 50 % IV SOLN
INTRAVENOUS | Status: AC
  Administered 2015-07-22: 25 mL via INTRAVENOUS
  Filled 2015-07-22: qty 50

## 2015-07-22 MED ORDER — POTASSIUM CHLORIDE 10 MEQ/50ML IV SOLN
10.0000 meq | INTRAVENOUS | Status: AC
  Administered 2015-07-22 (×6): 10 meq via INTRAVENOUS
  Filled 2015-07-22 (×5): qty 50

## 2015-07-22 MED ORDER — SODIUM CHLORIDE 0.9 % IV SOLN
INTRAVENOUS | Status: DC
  Administered 2015-07-22: 20:00:00 via INTRAVENOUS

## 2015-07-22 MED ORDER — DEXTROSE 50 % IV SOLN
25.0000 mL | Freq: Once | INTRAVENOUS | Status: AC
  Administered 2015-07-22: 25 mL via INTRAVENOUS

## 2015-07-22 NOTE — Progress Notes (Signed)
Tube feeds restarted at 30 cc/hr. Do not increase rate. PEr Dr. Delton CoombesByrum.

## 2015-07-22 NOTE — Progress Notes (Signed)
eLink Physician-Brief Progress Note Patient Name: Victor CornfieldRobert Mcdonald DOB: 06-May-1961 MRN: [id number]   Date of Service  07/22/2015  HPI/Events of Note  Blood glucose = 53. Enteral nutrition on hold d/t vomiting.   eICU Interventions  Will change IV fluids to: 1. D10W to run IV at 60 mL/hour. 2. 0.9 NaCl to run IV at 50 mL/hour.      Intervention Category Major Interventions: Other:  Lenell AntuSommer,Tuyen Uncapher Eugene 07/22/2015, 8:18 PM

## 2015-07-22 NOTE — Progress Notes (Signed)
Patient vomited and ET tube came out 3cm. RT advanced tube back in 3cm and patient is resting comfortably and getting good tidal volumes. Cuff pressure is 26.

## 2015-07-22 NOTE — Progress Notes (Signed)
Hypoglycemic Event  CBG: 55  Treatment: D50 IV 25 mL  Symptoms: None  Follow-up CBG: Time:2110 CBG Result:74  Possible Reasons for Event: Vomiting and Unknown  Comments/MD notified:Sommer, MD patient also placed on D10 gtt. Tube feeds were turned off as patient was vomiting prior.    Victor Mcdonald

## 2015-07-22 NOTE — Progress Notes (Signed)
Wasted 200mls of fentanyl gtt in sink with Maxine GlennMonica, RCharity fundraiser

## 2015-07-22 NOTE — Progress Notes (Signed)
eLink Physician-Brief Progress Note Patient Name: Victor CornfieldRobert Mcdonald DOB: Jun 29, 1961 MRN: [id number]   Date of Service  07/22/2015  HPI/Events of Note  Hypoglycemia - Patient has dropped blood glucose into the 30's twice.  eICU Interventions  Will increase D5W IV infusion ot 60 mL/hour.      Intervention Category Major Interventions: Other:  Lenell AntuSommer,Trammell Bowden Eugene 07/22/2015, 4:14 PM

## 2015-07-22 NOTE — Progress Notes (Signed)
PULMONARY / CRITICAL CARE MEDICINE   Name: Victor Mcdonald MRN: [id number] DOB: Aug 09, 1960    ADMISSION DATE:  07/26/2015  REFERRING MD:  Outside hospital ER, [facility] [place]  CHIEF COMPLAINT:  Can't breathe  SUBJECTIVE:  Has followed some commands today.  No further seizure activity reported.   VITAL SIGNS: BP 197/168 mmHg  Pulse 85  Temp(Src) 97.8 F (36.6 C) (Oral)  Resp 18  Ht 5\' 9"  (1.753 m)  Wt 62.7 kg (138 lb 3.7 oz)  BMI 20.40 kg/m2  SpO2 100%  VENTILATOR SETTINGS: Vent Mode:  [-] PSV;PRVC FiO2 (%):  [30 %] 30 % Set Rate:  [14 bmp] 14 bmp Vt Set:  [510 mL] 510 mL PEEP:  [5 cmH20] 5 cmH20 Pressure Support:  [5 cmH20-8 cmH20] 8 cmH20 Plateau Pressure:  [17 cmH20-24 cmH20] 17 cmH20  INTAKE / OUTPUT: I/O last 3 completed shifts: In: 4346.6 [I.V.:771.6; Other:70; NG/GT:900; IV Piggyback:2605] Out: 3599 [Urine:299; Stool:3300]  PHYSICAL EXAMINATION: General: on vent HENT: NCAT, ETT in place PULM: Crackles bilaterally, vent supported breaths CV: RRR, no mgr GI: BS+, soft, nontender MSK: normal bulk and tone DERM: arms/legs with edema Neuro: some agitation, followed some commands.   LABS:  BMET  Recent Labs Lab 07/20/15 0340 07/21/15 0430 07/22/15 0450  NA 140 141 144  K 3.7 2.6* 2.2*  CL 111 114* 119*  CO2 24 18* 16*  BUN 22* 26* 34*  CREATININE 1.58* 1.82* 1.97*  GLUCOSE 99 118* 119*    Electrolytes  Recent Labs Lab 07/20/15 0340 07/21/15 0430 07/22/15 0450  CALCIUM 7.5* 7.6* 7.5*  MG  --   --  1.7  PHOS  --   --  4.5    CBC  Recent Labs Lab 07/19/15 0407 07/21/15 0430 07/22/15 0450  WBC 8.2 12.5* 13.7*  HGB 8.3* 12.3* 10.0*  HCT 24.7* 35.4* 28.7*  PLT 140* 223 190    Coag's No results for input(s): APTT, INR in the last 168 hours.  Sepsis Markers No results for input(s): LATICACIDVEN, PROCALCITON, O2SATVEN in the last 168 hours.  ABG  Recent Labs Lab 07/17/15 0400  PHART 7.471*  PCO2ART 31.5*  PO2ART 163*    Liver  Enzymes No results for input(s): AST, ALT, ALKPHOS, BILITOT, ALBUMIN in the last 168 hours.  Cardiac Enzymes No results for input(s): TROPONINI, PROBNP in the last 168 hours.  Glucose  Recent Labs Lab 07/22/15 0346 07/22/15 0857 07/22/15 1238 07/22/15 1259 07/22/15 1558 07/22/15 1744  GLUCAP 114* 72 40* 114* 45* 71    Imaging 1/8 CXR > improved aeration bilaterally, ETT, CVL in place  STUDIES:  1/04 CT head >> negative 1/04 Abd u/s >> small ascites, normal CBG, no focal hepatic mass, absent GB 1/6 Echo> LVEF 55-60%, LA mildly dilated, no pFO, RVSP 39, small to mod effusion Head CT 1/13 >> No acute bleed or other change.  1/13 EEG >> non-specific encephalopathy, no seizures.   CULTURES: 1/04 Blood cx (OSH) >> 1/04 Sputum >> serratia >> pan-sens.  1/04 Lt stump wound > group b strep  1/7 RVP > negative  ANTIBIOTICS: 1/04 Vancomycin >> 1/8 1/04 Zosyn >> 1/7 1/7 ceftriaxone >   SIGNIFICANT EVENTS: 1/04 Transfer from OSH, GI and ortho consulted, started D10 infusion 1/05 Transfuse 1 unit PRBC 1/06 Hypoglycemia  LINES/TUBES: 1/04 ETT >> 1/04 Lt IJ CVL >>   DISCUSSION: 55 yo male transferred from outside hospital with acute hypoxic respiratory failure, b/l pulmonary infiltrates, possible upper GI bleeding, acute encephalopathy, hypoglycemia, and possible Lt  BKA stump infection.   ASSESSMENT / PLAN:  PULMONARY A: Acute hypoxic respiratory failure> ARDS vs pulm edema P:   Wean PEEP/FiO2 for goal PaO2 55-65 Follow ABG TVol 8cc/kg F/u CXR PRN albuterol Dosing lasix daily > defer 1/13 and 1/14 as CVP reaching goal and S Cr rising  CARDIOVASCULAR A:  Hx of HLD, HTN, chronic diastolic CHF Sinus bradycardia P:  PRN labetalol, hydralazine for SBP > 170 Increase scheduled hydralazine 1/12 Repeat ECG 1/12  RENAL A:   Hypokalemia AKI but volume up, FENA 1.2% P:   Monitor BMET and UOP Replace electrolytes as needed Hold lasix for now (held 1/13 and  1/14)  GASTROINTESTINAL A:   ?Upper GI bleed at presentation Hx of Hep C Hx of pancreatitis S/p Whipple procedure in 2011 Severe protein calorie malnutrition Ileus> likely narcotic related P:   Tube feeds > try to advance on 1/13, place post-pyloric tube Minimize fentanyl Protonix  GI with no plan for EGD at this time pending stabilization of other issues.  PPI to intermittent   HEMATOLOGIC A:   Acute blood loss anemia from reported upper GI bleed P:  F/u CBC Transfuse for Hb < 7 or bleeding SCD's for DVT prevention  INFECTIOUS A:   HCAP  RVP negative Lt BKA stump infection  P:   Continue ceftriaxone for serratia and group b strep in stump; day 10 of 10 on 1/13, stop 1/14 Will need an AKA per ortho, Again has been deferred pending overall improvement.   ENDOCRINE A:   Hypoglycemia Hx of DM type II P:   D10  > changed to d5 on 1/9 SSI  NEUROLOGIC A:   Acute metabolic encephalopathy Possible seizure activity 1/13 am ? Hx EtOH, at risk withdrawal P:   RASS goal: -1 Fentanyl gtt> minimize dose Propofol gtt off Completed folate and thiamine Daily WUA EEG today Keppra loaded > will continue for now Will consult neuro if any further episodes that suggest seizure activity  Family contact - Hassie BruceBrother, Michael >> [phone]  Independent CC time 35 minutes.  Levy Pupaobert Sharran Caratachea, MD, PhD 07/22/2015, 6:12 PM [place] [phone] or if no answer [phone]

## 2015-07-22 NOTE — Progress Notes (Signed)
CRITICAL VALUE ALERT  Critical value received:  K+ 2.2  Date of notification:  07/22/15  Time of notification:  0532  Critical value read back:Yes.    Nurse who received alert:  Mindy RN  MD notified (1st page):  Allena KatzPatel, MD  Time of first page:  [phone]  Responding MD:  Allena KatzPatel, MD  Time MD responded:  [phone]

## 2015-07-22 NOTE — Progress Notes (Signed)
eLink Physician-Brief Progress Note Patient Name: Victor CornfieldRobert Mcdonald DOB: [birth year] MRN: [id number]   Date of Service  07/22/2015  HPI/Events of Note  K+ = 2.3 and Creatinine = 2.0. Having massive diarrhea.   eICU Interventions  Will replete K+ and recheck BMP in AM.      Intervention Category Intermediate Interventions: Electrolyte abnormality - evaluation and management  Frederich Montilla Eugene 07/22/2015, 7:15 PM

## 2015-07-23 ENCOUNTER — Inpatient Hospital Stay (HOSPITAL_COMMUNITY): Payer: Medicare Other

## 2015-07-23 LAB — GLUCOSE, CAPILLARY
GLUCOSE-CAPILLARY: 116 mg/dL — AB (ref 65–99)
GLUCOSE-CAPILLARY: 134 mg/dL — AB (ref 65–99)
GLUCOSE-CAPILLARY: 144 mg/dL — AB (ref 65–99)
GLUCOSE-CAPILLARY: 56 mg/dL — AB (ref 65–99)
GLUCOSE-CAPILLARY: 67 mg/dL (ref 65–99)
GLUCOSE-CAPILLARY: 69 mg/dL (ref 65–99)
GLUCOSE-CAPILLARY: 75 mg/dL (ref 65–99)
GLUCOSE-CAPILLARY: 76 mg/dL (ref 65–99)
Glucose-Capillary: 33 mg/dL — CL (ref 65–99)
Glucose-Capillary: 34 mg/dL — CL (ref 65–99)
Glucose-Capillary: 114 mg/dL — ABNORMAL HIGH (ref 65–99)
Glucose-Capillary: 82 mg/dL (ref 65–99)

## 2015-07-23 LAB — BASIC METABOLIC PANEL
ANION GAP: 7 (ref 5–15)
Anion gap: 8 (ref 5–15)
BUN: 37 mg/dL — AB (ref 6–20)
BUN: 34 mg/dL — ABNORMAL HIGH (ref 6–20)
CALCIUM: 7.4 mg/dL — AB (ref 8.9–10.3)
CALCIUM: 7.1 mg/dL — AB (ref 8.9–10.3)
CHLORIDE: 121 mmol/L — AB (ref 101–111)
CHLORIDE: 121 mmol/L — AB (ref 101–111)
CO2: 15 mmol/L — ABNORMAL LOW (ref 22–32)
CO2: 14 mmol/L — ABNORMAL LOW (ref 22–32)
CREATININE: 1.88 mg/dL — AB (ref 0.61–1.24)
CREATININE: 1.74 mg/dL — AB (ref 0.61–1.24)
GFR calc Af Amer: 45 mL/min — ABNORMAL LOW (ref >60)
GFR calc non Af Amer: 39 mL/min — ABNORMAL LOW (ref >60)
GFR calc non Af Amer: 43 mL/min — ABNORMAL LOW (ref >60)
GFR, EST AFRICAN AMERICAN: 50 mL/min — AB (ref >60)
Glucose, Bld: 118 mg/dL — ABNORMAL HIGH (ref 65–99)
Glucose, Bld: 116 mg/dL — ABNORMAL HIGH (ref 65–99)
Potassium: 2.1 mmol/L — CL (ref 3.5–5.1)
Potassium: 2.5 mmol/L — CL (ref 3.5–5.1)
SODIUM: 144 mmol/L (ref 135–145)
SODIUM: 142 mmol/L (ref 135–145)

## 2015-07-23 LAB — PHOSPHORUS: Phosphorus: 3.7 mg/dL (ref 2.5–4.6)

## 2015-07-23 LAB — POCT I-STAT 3, ART BLOOD GAS (G3+)
ACID-BASE DEFICIT: 12 mmol/L — AB (ref 0.0–2.0)
BICARBONATE: 13 meq/L — AB (ref 20.0–24.0)
O2 SAT: 98 %
PO2 ART: 95 mmHg (ref 80.0–100.0)
Patient temperature: 94.4
TCO2: 14 mmol/L (ref 0–100)
pCO2 arterial: 23.3 mmHg — ABNORMAL LOW (ref 35.0–45.0)
pH, Arterial: 7.341 — ABNORMAL LOW (ref 7.350–7.450)

## 2015-07-23 LAB — CBC
HCT: 25.4 % — ABNORMAL LOW (ref 39.0–52.0)
HEMOGLOBIN: 8.8 g/dL — AB (ref 13.0–17.0)
MCH: 29 pg (ref 26.0–34.0)
MCHC: 34.6 g/dL (ref 30.0–36.0)
MCV: 83.8 fL (ref 78.0–100.0)
Platelets: 144 10*3/uL — ABNORMAL LOW (ref 150–400)
RBC: 3.03 MIL/uL — ABNORMAL LOW (ref 4.22–5.81)
RDW: 15.8 % — ABNORMAL HIGH (ref 11.5–15.5)
WBC: 11.2 10*3/uL — ABNORMAL HIGH (ref 4.0–10.5)

## 2015-07-23 LAB — MAGNESIUM
MAGNESIUM: 1.6 mg/dL — AB (ref 1.7–2.4)
Magnesium: 1.9 mg/dL (ref 1.7–2.4)

## 2015-07-23 MED ORDER — FENTANYL CITRATE (PF) 100 MCG/2ML IJ SOLN: 12.5000 ug | INTRAMUSCULAR | Status: DC | PRN

## 2015-07-23 MED ORDER — DEXTROSE 50 % IV SOLN
25.0000 mL | Freq: Once | INTRAVENOUS | Status: AC
Start: 2015-07-23 — End: 2015-07-23
  Administered 2015-07-23: 25 mL via INTRAVENOUS

## 2015-07-23 MED ORDER — DEXTROSE 50 % IV SOLN
INTRAVENOUS | Status: AC
  Filled 2015-07-23: qty 50

## 2015-07-23 MED ORDER — LORAZEPAM 2 MG/ML IJ SOLN: 1.0000 mg | INTRAMUSCULAR | Status: DC | PRN

## 2015-07-23 MED ORDER — MAGNESIUM SULFATE 2 GM/50ML IV SOLN
2.0000 g | Freq: Once | INTRAVENOUS | Status: AC
  Administered 2015-07-23: 2 g via INTRAVENOUS
  Filled 2015-07-23: qty 50

## 2015-07-23 MED ORDER — POTASSIUM CHLORIDE 10 MEQ/50ML IV SOLN
10.0000 meq | INTRAVENOUS | Status: AC
  Administered 2015-07-23 – 2015-07-24 (×6): 10 meq via INTRAVENOUS
  Filled 2015-07-23 (×6): qty 50

## 2015-07-23 MED ORDER — DEXTROSE 50 % IV SOLN
50.0000 mL | Freq: Once | INTRAVENOUS | Status: AC
  Administered 2015-07-23: 50 mL via INTRAVENOUS

## 2015-07-23 MED ORDER — SODIUM BICARBONATE 8.4 % IV SOLN
INTRAVENOUS | Status: AC
  Administered 2015-07-23: 15:00:00 via INTRAVENOUS
  Filled 2015-07-23: qty 1000

## 2015-07-23 MED ORDER — DEXTROSE 50 % IV SOLN
INTRAVENOUS | Status: AC
  Administered 2015-07-23: 50 mL
  Filled 2015-07-23: qty 50

## 2015-07-23 MED ORDER — ALTEPLASE 2 MG IJ SOLR
2.0000 mg | Freq: Once | INTRAMUSCULAR | Status: AC
  Administered 2015-07-23: 2 mg
  Filled 2015-07-23: qty 2

## 2015-07-23 MED ORDER — POTASSIUM CHLORIDE 10 MEQ/50ML IV SOLN
10.0000 meq | INTRAVENOUS | Status: AC
  Administered 2015-07-23 (×10): 10 meq via INTRAVENOUS
  Filled 2015-07-23 (×9): qty 50

## 2015-07-23 NOTE — Progress Notes (Signed)
[facility]OTOC[facility] FOR AM LAB REPLACEMENT ONLY  The patient does [facility] Protocol based on the criteria listed below:   1. Is GFR >/= 40 ml/min? Yes.    Patient's GFR today is 45 2. Is urine output >/= 0.5 ml/kg/hr for the last 6 hours? Yes.   Patient's UOP is 1.7 ml/kg/hr 3. Is BUN < 60 mg/dL? Yes.    Patient's BUN today is 37 4. Abnormal electrolyte(s):K 2.1, Phos 1.6 5. Ordered repletion with: per protcol 6. If a panic level lab has been reported, has the CCM MD in charge been notified? Yes.  .   Physician:  Henry RusselJames Smith  [facility], Jonavin Seder A 07/23/2015 5:52 AM

## 2015-07-23 NOTE — Progress Notes (Signed)
CRITICAL VALUE ALERT  Critical value received:  K+ 2.1  Date of notification:  07/23/15  Time of notification:  0545  Critical value read back:Yes.    Nurse who received alert:  Remonia RichterLindsey Walsh RN  MD notified (1st page):  eLink MD  Time of first page:  [phone]

## 2015-07-23 NOTE — Procedures (Signed)
Extubation Procedure Note  Patient Details:   Name: Victor CornfieldRobert Mcdonald DOB: [birth year] MRN: [id number]   Airway Documentation:   Extubated to 6l/min [place] Weak cough and vocalization Incentive spirometer instructed.    Evaluation  O2 sats: stable throughout Complications: No apparent complications Patient did tolerate procedure well. Bilateral Breath Sounds: Rhonchi, Diminished Suctioning: Airway Yes  Newt LukesGroendal, Khalilah Hoke Ann 07/23/2015, 1:13 PM

## 2015-07-23 NOTE — Progress Notes (Signed)
eLink Physician-Brief Progress Note Patient Name: Kathreen CornfieldRobert Novitski DOB: Nov 29, 1960 MRN: [id number]   Date of Service  07/23/2015  HPI/Events of Note  Multiple issues: 1. Decreased LOC, 2. K+ = 2.5 and Creatinine = 1.74 and 3. Brown port of central line not able to draw blood.  eICU Interventions  Will order:  1. ABG now. 2. Replete K+. 3. Cathflo to brown port of central venous line.      Intervention Category Major Interventions: Electrolyte abnormality - evaluation and management;Change in mental status - evaluation and management Intermediate Interventions: Other:  Lenell AntuSommer,Joahan Swatzell Eugene 07/23/2015, 9:51 PM

## 2015-07-23 NOTE — Progress Notes (Signed)
Hypoglycemic Event  CBG: 34 right hand at 1612 Repeat CBG: 35 left hand at 1615  Treatment: 1 amp dextrose 50  Symptoms: None  Follow-up CBG: Time: 1630 CBG Result:144 finger stick                               Blood draw verification from CVC at 1633 CBG result: 134  Possible Reasons for Event: unknown  Comments/MD notified:Dr. Delton CoombesByrum aware    Victor Mcdonald, Victor Mcdonald

## 2015-07-23 NOTE — Progress Notes (Signed)
PULMONARY / CRITICAL CARE MEDICINE   Name: Victor CornfieldRobert Mcdonald MRN: [id number] DOB: [birth year]    ADMISSION DATE:  07/22/2015  REFERRING MD:  [facility]  CHIEF COMPLAINT:  Can't breathe  SUBJECTIVE: Afebrile, alert, following commands, not complaining of any pain, on minimal ventilator support, hypothermic  VITAL SIGNS: BP 172/91 mmHg  Pulse 62  Temp(Src) 94.3 F (34.6 C) (Oral)  Resp 24  Ht 5\' 9"  (1.753 m)  Wt 61.9 kg (136 lb 7.4 oz)  BMI 20.14 kg/m2  SpO2 97%  VENTILATOR SETTINGS: Vent Mode:  [-] PRVC FiO2 (%):  [30 %] 30 % Set Rate:  [14 bmp] 14 bmp Vt Set:  [510 mL] 510 mL PEEP:  [5 cmH20] 5 cmH20 Pressure Support:  [5 cmH20-8 cmH20] 8 cmH20 Plateau Pressure:  [9 cmH20-16 cmH20] 9 cmH20  INTAKE / OUTPUT: I/O last 3 completed shifts: In: 3224.1 [I.V.:1659.1; NG/GT:750; IV Piggyback:815] Out: 4098 [[id number] [Urine:1686; Stool:3550]  PHYSICAL EXAMINATION: GEN: Awake, alert, on vent HEENT: Moist oral mucosa, no JVD, PERRLA, ETT in place PULM: Crackles bilaterally, vent supported breaths CV: RRR, no murmurs/rubs/gallops GI: BS+, soft, nontender MSK: normal bulk and tone, LLE BKA stump with open site, without much drainage SKIN: 1+ edema, no rashes, cool dry skin NEURO: mental status is good, nods y/n, follows commands moving all extremities.   LABS:  BMET  Recent Labs Lab 07/22/15 0450 07/22/15 1735 07/23/15 0455  NA 144 143 144  K 2.2* 2.3* 2.1*  CL 119* 120* 121*  CO2 16* 16* 15*  BUN 34* 37* 37*  CREATININE 1.97* 2.00* 1.88*  GLUCOSE 119* 79 118*    Electrolytes  Recent Labs Lab 07/22/15 0450 07/22/15 1735 07/23/15 0455  CALCIUM 7.5* 7.3* 7.4*  MG 1.7  --  1.6*  PHOS 4.5  --  3.7    CBC  Recent Labs Lab 07/21/15 0430 07/22/15 0450 07/23/15 0455  WBC 12.5* 13.7* 11.2*  HGB 12.3* 10.0* 8.8*  HCT 35.4* 28.7* 25.4*  PLT 223 190 144*    Coag's No results for input(s): APTT, INR in the last 168 hours.  Sepsis Markers No  results for input(s): LATICACIDVEN, PROCALCITON, O2SATVEN in the last 168 hours.  ABG  Recent Labs Lab 07/17/15 0400  PHART 7.471*  PCO2ART 31.5*  PO2ART 163*    Liver Enzymes No results for input(s): AST, ALT, ALKPHOS, BILITOT, ALBUMIN in the last 168 hours.  Cardiac Enzymes No results for input(s): TROPONINI, PROBNP in the last 168 hours.  Glucose  Recent Labs Lab 07/22/15 2009 07/22/15 2117 07/22/15 2336 07/22/15 2358 07/23/15 0418 07/23/15 0449  GLUCAP 55* 76 67 126* 69 116*    Imaging 1/14 CXR > reviewed, ETT slightly high position, effusions, atelectasis b/l  STUDIES:  1/04 CT head >> negative 1/04 Abd u/s >> small ascites, normal CBG, no focal hepatic mass, absent GB 1/6 Echo> LVEF 55-60%, LA mildly dilated, no pFO, RVSP 39, small to mod effusion Head CT 1/13 >> No acute bleed or other change.  1/13 EEG >> non-specific encephalopathy, no seizures.   CULTURES: 1/04 Blood cx (OSH) >> 1/04 Sputum >> serratia >> pan-sens.  1/04 Lt stump wound > group b strep  1/7 RVP > negative  ANTIBIOTICS: 1/04 Vancomycin >> 1/8 1/04 Zosyn >> 1/7 1/7 ceftriaxone >   SIGNIFICANT EVENTS: 1/04 Transfer from OSH, GI and ortho consulted, started D10 infusion 1/05 Transfuse 1 unit PRBC 1/06 Hypoglycemia  LINES/TUBES: 1/04 ETT >> 1/04 Lt IJ CVL >>   DISCUSSION: 55 yo  male transferred from outside hospital with acute hypoxic respiratory failure, b/l pulmonary infiltrates, possible upper GI bleeding, acute encephalopathy, hypoglycemia, and possible Lt BKA stump infection.   ASSESSMENT / PLAN:  PULMONARY A: Acute hypoxic respiratory failure> ARDS vs pulm edema P:   WUA/SBT likely extubation today CXR most consistent with pulm edema PRN albuterol Dosing lasix daily > deferred to 1/14 for Cr rise, resume as tolerating renal fxn  CARDIOVASCULAR A:  Hx of HLD, HTN, chronic diastolic CHF Sinus bradycardia P:  PRN labetalol, hydralazine for SBP > 170 Hydralazine  q6hrs, clonidine patch BP control  RENAL A:   Hypokalemia AKI but volume up, FENA 1.2% Non-AG acidosis P:   Monitor BMET and UOP Persistent low K, replace many runs Replace mag Lasix as tolerating with trending down Cr Start low dose Na bicarb gtt  GASTROINTESTINAL A:   ?Upper GI bleed at presentation Hx of Hep C Hx of pancreatitis S/p Whipple procedure in 2011 Severe protein calorie malnutrition Ileus> likely narcotic related P:   Tube feeds > try to advance on 1/13, place post-pyloric tube Minimize fentanyl Protonix GI ppx GI with no plan for EGD at this time pending stabilization of other issues.   HEMATOLOGIC A:   Acute blood loss anemia from reported upper GI bleed P:  F/u CBC Transfuse for Hb < 7 or bleeding SCD's for DVT prevention  INFECTIOUS A:   HCAP  RVP negative L BKA stump infection P:   Completed 10 days ceftriaxone for serratia and GBS in L stump Will need an AKA per ortho, pending improvement in status Resume bair hugger for hypothermia  ENDOCRINE A:   Hypoglycemia Hx of DM type II P:   D10W for hypoglycemia SSI  NEUROLOGIC A:   Acute metabolic encephalopathy Possible seizure activity 1/13 am, EEG 1/14 no clear Sz activity ? Hx EtOH, at risk withdrawal P:   RASS goal: -1 Minimize fentanyl Completed folate and thiamine Daily WUA/SBT Keppra loaded > will continue for now Will consult neuro if any further episodes that suggest seizure activity  Family contact - Hassie Bruce >> [phone]   Fuller Plan, MD PGY-I Internal Medicine Resident Pager# [phone] 07/23/2015, 8:13 AM   Attending Note:  I have examined patient, reviewed labs, studies and notes. I have discussed the case with Dr Dimple Casey, and I agree with the data and plans as amended above. Complicated course with VDRF in setting HCAP and stump infection, encephalopathy, renal failure. On my eval today he is much more awake, is following commands. He has  hypoMg, being replaced. Also non-AG acidosis - will start low dose Na bicarb.  Independent critical care time is 35 minutes.   Levy Pupa, MD, PhD 07/23/2015, 1:15 PM  Pulmonary and Critical Care [phone] or if no answer [phone]

## 2015-07-23 NOTE — Progress Notes (Signed)
Hypoglycemic Event  CBG: 67  Treatment: D50 IV 25 mL  Symptoms: None  Follow-up CBG: Time:0000 CBG Result: 126  Possible Reasons for Event: Unknown      Clarene EssexLindsey E Walsh

## 2015-07-23 NOTE — Progress Notes (Signed)
Hypoglycemic Event  CBG: 69  Treatment: D50 IV 25 mL  Symptoms: None  Follow-up CBG: Time:0445 CBG Result:116  Possible Reasons for Event: Inadequate meal intake and Unknown    Clarene EssexLindsey E Walsh

## 2015-07-23 NOTE — Progress Notes (Signed)
Wasted 140cc Fentanyl drip IV. Wasted via sink in patient room. Witnessed by Basil Dessyril Forshe, RN.

## 2015-07-24 ENCOUNTER — Inpatient Hospital Stay (HOSPITAL_COMMUNITY): Payer: Medicare Other

## 2015-07-24 ENCOUNTER — Other Ambulatory Visit (HOSPITAL_COMMUNITY): Payer: Medicare Other

## 2015-07-24 DIAGNOSIS — G9340 Encephalopathy, unspecified: Secondary | ICD-10-CM | POA: Insufficient documentation

## 2015-07-24 DIAGNOSIS — J9601 Acute respiratory failure with hypoxia: Secondary | ICD-10-CM

## 2015-07-24 LAB — BASIC METABOLIC PANEL
Anion gap: 5 (ref 5–15)
BUN: 35 mg/dL — AB (ref 6–20)
CALCIUM: 7.1 mg/dL — AB (ref 8.9–10.3)
CO2: 16 mmol/L — ABNORMAL LOW (ref 22–32)
CREATININE: 1.75 mg/dL — AB (ref 0.61–1.24)
Chloride: 122 mmol/L — ABNORMAL HIGH (ref 101–111)
GFR calc Af Amer: 50 mL/min — ABNORMAL LOW (ref >60)
GFR, EST NON AFRICAN AMERICAN: 43 mL/min — AB (ref >60)
GLUCOSE: 107 mg/dL — AB (ref 65–99)
Potassium: 2.8 mmol/L — ABNORMAL LOW (ref 3.5–5.1)
SODIUM: 143 mmol/L (ref 135–145)

## 2015-07-24 LAB — BLOOD GAS, ARTERIAL
ACID-BASE DEFICIT: 10.9 mmol/L — AB (ref 0.0–2.0)
BICARBONATE: 13 meq/L — AB (ref 20.0–24.0)
Drawn by: 437071
FIO2: 1
LHR: 14 {breaths}/min
MECHVT: 510 mL
O2 SAT: 99.6 %
PATIENT TEMPERATURE: 98.6
PEEP/CPAP: 5 cmH2O
PH ART: 7.401 (ref 7.350–7.450)
PO2 ART: 324 mmHg — AB (ref 80.0–100.0)
TCO2: 13.7 mmol/L (ref 0–100)
pCO2 arterial: 21.4 mmHg — ABNORMAL LOW (ref 35.0–45.0)

## 2015-07-24 LAB — MAGNESIUM: MAGNESIUM: 1.8 mg/dL (ref 1.7–2.4)

## 2015-07-24 LAB — GLUCOSE, CAPILLARY
GLUCOSE-CAPILLARY: 104 mg/dL — AB (ref 65–99)
GLUCOSE-CAPILLARY: 93 mg/dL (ref 65–99)
GLUCOSE-CAPILLARY: 122 mg/dL — AB (ref 65–99)
GLUCOSE-CAPILLARY: 140 mg/dL — AB (ref 65–99)
Glucose-Capillary: 99 mg/dL (ref 65–99)
Glucose-Capillary: 45 mg/dL — ABNORMAL LOW (ref 65–99)
Glucose-Capillary: 42 mg/dL — CL (ref 65–99)
Glucose-Capillary: 76 mg/dL (ref 65–99)
Glucose-Capillary: 62 mg/dL — ABNORMAL LOW (ref 65–99)

## 2015-07-24 LAB — CBC
HCT: 23.7 % — ABNORMAL LOW (ref 39.0–52.0)
Hemoglobin: 8.3 g/dL — ABNORMAL LOW (ref 13.0–17.0)
MCH: 28.6 pg (ref 26.0–34.0)
MCHC: 35 g/dL (ref 30.0–36.0)
MCV: 81.7 fL (ref 78.0–100.0)
PLATELETS: 120 10*3/uL — AB (ref 150–400)
RBC: 2.9 MIL/uL — ABNORMAL LOW (ref 4.22–5.81)
RDW: 15.6 % — AB (ref 11.5–15.5)
WBC: 9.7 10*3/uL (ref 4.0–10.5)

## 2015-07-24 LAB — TRIGLYCERIDES: Triglycerides: 16 mg/dL (ref <150)

## 2015-07-24 MED ORDER — MIDAZOLAM HCL 2 MG/2ML IJ SOLN
INTRAMUSCULAR | Status: AC
  Administered 2015-07-24: 2 mg via INTRAVENOUS
  Filled 2015-07-24: qty 4

## 2015-07-24 MED ORDER — MIDAZOLAM HCL 2 MG/2ML IJ SOLN
2.0000 mg | INTRAMUSCULAR | Status: DC | PRN
Start: 2015-07-24 — End: 2015-07-25
  Administered 2015-07-24: 2 mg via INTRAVENOUS
  Filled 2015-07-24: qty 2

## 2015-07-24 MED ORDER — DEXTROSE 50 % IV SOLN
INTRAVENOUS | Status: AC
Start: 2015-07-24 — End: 2015-07-24
  Administered 2015-07-24: 50 mL
  Filled 2015-07-24: qty 50

## 2015-07-24 MED ORDER — FUROSEMIDE 10 MG/ML IJ SOLN
40.0000 mg | Freq: Once | INTRAMUSCULAR | Status: AC
  Administered 2015-07-24: 40 mg via INTRAVENOUS
  Filled 2015-07-24: qty 4

## 2015-07-24 MED ORDER — PROPOFOL 1000 MG/100ML IV EMUL
0.0000 ug/kg/min | INTRAVENOUS | Status: DC
  Administered 2015-07-24: 10 ug/kg/min via INTRAVENOUS

## 2015-07-24 MED ORDER — ETOMIDATE 2 MG/ML IV SOLN
0.3000 mg/kg | Freq: Once | INTRAVENOUS | Status: AC
  Administered 2015-07-24: 20 mg via INTRAVENOUS

## 2015-07-24 MED ORDER — POTASSIUM CHLORIDE 10 MEQ/50ML IV SOLN
10.0000 meq | INTRAVENOUS | Status: AC
  Administered 2015-07-24 (×6): 10 meq via INTRAVENOUS
  Filled 2015-07-24 (×7): qty 50

## 2015-07-24 MED ORDER — SODIUM BICARBONATE 8.4 % IV SOLN
INTRAVENOUS | Status: DC
  Administered 2015-07-24 – 2015-07-26 (×3): via INTRAVENOUS
  Filled 2015-07-24 (×4): qty 150

## 2015-07-24 MED ORDER — DEXTROSE 50 % IV SOLN
INTRAVENOUS | Status: AC
  Administered 2015-07-24: 50 mL
  Filled 2015-07-24: qty 50

## 2015-07-24 MED ORDER — DEXTROSE 50 % IV SOLN: 50.0000 mL | Freq: Once | INTRAVENOUS | Status: AC

## 2015-07-24 MED ORDER — FENTANYL CITRATE (PF) 100 MCG/2ML IJ SOLN
INTRAMUSCULAR | Status: AC
  Administered 2015-07-24: 50 ug
  Filled 2015-07-24: qty 2

## 2015-07-24 MED ORDER — MIDAZOLAM HCL 2 MG/2ML IJ SOLN: 2.0000 mg | INTRAMUSCULAR | Status: DC | PRN

## 2015-07-24 MED ORDER — PROPOFOL 1000 MG/100ML IV EMUL
INTRAVENOUS | Status: AC
  Filled 2015-07-24: qty 100

## 2015-07-24 MED ORDER — HYDRALAZINE HCL 50 MG PO TABS
50.0000 mg | ORAL_TABLET | Freq: Four times a day (QID) | ORAL | Status: DC
  Administered 2015-07-24 (×2): 50 mg
  Filled 2015-07-24 (×8): qty 1

## 2015-07-24 MED ORDER — SODIUM CHLORIDE 0.9 % IV BOLUS (SEPSIS)
500.0000 mL | Freq: Once | INTRAVENOUS | Status: AC
  Administered 2015-07-24: 500 mL via INTRAVENOUS

## 2015-07-24 NOTE — Procedures (Signed)
Intubation Procedure Note Kathreen CornfieldRobert Ellender [id number] Jul 08, 1961  Procedure: Intubation Indications: Respiratory insufficiency  Procedure Details Consent: Unable to obtain consent because of emergent medical necessity. Time Out: Verified patient identification, verified procedure, site/side was marked, verified correct patient position, special equipment/implants available, medications/allergies/relevent history reviewed, required imaging and test results available.  Performed  Appropriate PPE was used including gloves, hand hygiene and mask.  Blade: Miller 2  Sats 88-89% on NRB. Premedication with fentanyl and versed.  BVM assisted ventilation with sats low 90s.  20mg  Etomidate.  Patient could be bagged easily and sats rose to 95% Single attempt with Hyacinth MeekerMiller 2. Grade 1 view of cords. 7.5ETT advanced between the cords under direct visualization to 22cm at the teeth. Immediate return of yellow frothy secretions. Cuff inflated. Good color change, symmetrical chest rise, bilateral breath sounds, absent sounds over the epigastrium. Patient then vomited and ~100cc yellow fluid suctioned from oral cavity. Post-intubation sats 97%. CXR with tube in adequate position.   Evaluation Hemodynamic Status: Transient hypotension treated with fluid; O2 sats: transiently fell during during procedure; nadir sat 78%, recovered quickly to 90s.  Patient's Current Condition: stable Complications: No apparent complications Patient did tolerate procedure well. Chest X-ray ordered to verify placement.  CXR: ETT in adequate position below clavicles and above carina.    Orville GovernSarah Ellen Shakthi Scipio, MD  07/24/2015

## 2015-07-24 NOTE — Progress Notes (Signed)
eLink Physician-Brief Progress Note Patient Name: Kathreen CornfieldRobert Boltz DOB: [birth year] MRN: [id number]   Date of Service  07/24/2015  HPI/Events of Note  Camera Chk patient with worsening respiratory failure. Sat 88% & RR 44 on NRB. Extubated 1/15. Diuresis on hold due to worsening renal function.   eICU Interventions  Contacted Fellow to assess patient at bedside for probable intubation.     Intervention Category Major Interventions: Respiratory failure - evaluation and management  Lawanda CousinsJennings Nestor 07/24/2015, 4:32 AM

## 2015-07-24 NOTE — Progress Notes (Signed)
Patient at shift change was noted to be unresponsive, tachypneic, using accessory muscles, and sats in low 90s on a non rebreather.  Resident Allena KatzPatel notified orders were placed.  Patient was not intubated at this time.  Will continue to monitor.

## 2015-07-24 NOTE — Progress Notes (Signed)
SLP Cancellation Note  Patient Details Name: Victor Mcdonald MRN: [id number] DOB: [birth year]   Cancelled treatment:       Reason Eval/Treat Not Completed: Patient not medically ready. Sign off   Harry Bark, Riley NearingBonnie Caroline 07/24/2015, 8:02 AM

## 2015-07-24 NOTE — Procedures (Signed)
History: 55 year old male presenting with respiratory failure, had an apparent seizure activity 1/13.  Sedation: Intermittent midazolam pushes  Technique: This is a 21 channel routine scalp EEG performed at the bedside with bipolar and monopolar montages arranged in accordance to the international 10/20 system of electrode placement. One channel was dedicated to EKG recording.    Background: The background consists of low voltage generalized irregular delta activity. There is a marked paucity of faster frequencies. There is no PDR recorded.  Photic stimulation: Physiologic driving is now performed  EEG Abnormalities: 1) generalized slow activity 2) absent PDR  Clinical Interpretation: This EEG is consistent with a generalized non-specific cerebral dysfunction(encephalopathy). There was no seizure or seizure predisposition recorded on this study.   Ritta SlotMcNeill Zen Felling, MD [facility] [phone]  If 7pm- 7am, please page neurology on call as listed in AMION.

## 2015-07-24 NOTE — Progress Notes (Signed)
Hypoglycemic Event  CBG: 62  Treatment: D50 IV 50 mL  Symptoms: None  Follow-up CBG: Time: 815 CBG Result: 140  Possible Reasons for Event: Unknown  Comments/MD notified:Elink MD    Victor Mcdonald [name]

## 2015-07-24 NOTE — Progress Notes (Signed)
PULMONARY / CRITICAL CARE MEDICINE   Name: Victor Mcdonald MRN: [id number] DOB: [birth year]    ADMISSION DATE:  July 13, 2015  REFERRING MD:  Outside hospital ER, [facility]  CHIEF COMPLAINT:  Can't breathe  SUBJECTIVE: Patient extubated yesterday then developed worsening respiratory failure on NRB concerning for vomiting and aspiration and was re-intubated, prominent secretions noted, recurrent emesis of 100cc contents  VITAL SIGNS: BP 100/71 mmHg  Pulse 98  Temp(Src) 94.4 F (34.7 C) (Oral)  Resp 28  Ht 5\' 9"  (1.753 m)  Wt 60.4 kg (133 lb 2.5 oz)  BMI 19.66 kg/m2  SpO2 100%  VENTILATOR SETTINGS: Vent Mode:  [-] PRVC FiO2 (%):  [30 %-100 %] 100 % Set Rate:  [14 bmp] 14 bmp Vt Set:  [510 mL] 510 mL PEEP:  [5 cmH20] 5 cmH20 Plateau Pressure:  [27 cmH20] 27 cmH20  INTAKE / OUTPUT: I/O last 3 completed shifts: In: 4755.3 [I.V.:2735.3; Other:300; NG/GT:300; IV Piggyback:1420] Out: 5060 [Urine:3510; Emesis/NG output:100; Stool:1450]  PHYSICAL EXAMINATION: GEN: chronically ill appearing, on vent HEENT: yellow/gastric contents in/around mouth, no JVD, ETT in place PULM: R base diminished air movement, coarse crackles in other fields CV: RRR, no murmurs/rubs/gallops GI: BS+, soft, nontender MSK: normal bulk and tone, LLE BKA stump with open site, without much drainage SKIN: 1+ edema, no rashes, cool dry skin NEURO: RASS -5, recent underwent sedation/paralysis for intubation  LABS:  BMET  Recent Labs Lab 07/23/15 0455 07/23/15 2030 07/24/15 0540  NA 144 142 143  K 2.1* 2.5* 2.8*  CL 121* 121* 122*  CO2 15* 14* 16*  BUN 37* 34* 35*  CREATININE 1.88* 1.74* 1.75*  GLUCOSE 118* 116* 107*    Electrolytes  Recent Labs Lab 07/22/15 0450  07/23/15 0455 07/23/15 2030 07/24/15 0540  CALCIUM 7.5*  < > 7.4* 7.1* 7.1*  MG 1.7  --  1.6* 1.9 1.8  PHOS 4.5  --  3.7  --   --   < > = values in this interval not displayed.  CBC  Recent Labs Lab 07/22/15 0450  07/23/15 0455 07/24/15 0540  WBC 13.7* 11.2* 9.7  HGB 10.0* 8.8* 8.3*  HCT 28.7* 25.4* 23.7*  PLT 190 144* 120*    Coag's No results for input(s): APTT, INR in the last 168 hours.  Sepsis Markers No results for input(s): LATICACIDVEN, PROCALCITON, O2SATVEN in the last 168 hours.  ABG  Recent Labs Lab 07/23/15 2158 07/24/15 0620  PHART 7.341* 7.401  PCO2ART 23.3* 21.4*  PO2ART 95.0 324*    Liver Enzymes No results for input(s): AST, ALT, ALKPHOS, BILITOT, ALBUMIN in the last 168 hours.  Cardiac Enzymes No results for input(s): TROPONINI, PROBNP in the last 168 hours.  Glucose  Recent Labs Lab 07/23/15 1633 07/23/15 1953 07/23/15 2043 07/23/15 2214 07/24/15 0001 07/24/15 0417  GLUCAP 134* 56* 114* 82 99 104*    Imaging 1/16 CXR > reviewed, ETT in good position, diffuse airspace disease with good expansion bilaterally  STUDIES:  1/04 CT head >> negative 1/04 Abd u/s >> small ascites, normal CBG, no focal hepatic mass, absent GB 1/6 Echo> LVEF 55-60%, LA mildly dilated, no pFO, RVSP 39, small to mod effusion Head CT 1/13 >> No acute bleed or other change.  1/13 EEG >> non-specific encephalopathy, no seizures.   CULTURES: 1/04 Blood cx (OSH) >> 1/04 Sputum >> serratia >> pan-sens.  1/04 Lt stump wound > group b strep  1/7 RVP > negative  ANTIBIOTICS: 1/4 Vancomycin >> 1/8 1/4 Zosyn >> 1/7  1/7 ceftriaxone > 1/13  SIGNIFICANT EVENTS: 1/04 Transfer from OSH, GI and ortho consulted, started D10 infusion 1/05 Transfuse 1 unit PRBC 1/06 Hypoglycemia  LINES/TUBES: 1/04 ETT >> 1/15, 1/16 >>  1/04 Lt IJ CVL >>   DISCUSSION: 55 yo male transferred from outside hospital with acute hypoxic respiratory failure, b/l pulmonary infiltrates, possible upper GI bleeding, acute encephalopathy, hypoglycemia, and possible Lt BKA stump infection.   ASSESSMENT / PLAN:  PULMONARY A: Acute hypoxic respiratory failure> ARDS vs pulm edema Diffuse airspace  disease P:   Full vent support, reintubated this morning PRN albuterol Dosing lasix daily as tolerating renal fxn  CARDIOVASCULAR A:  Hx of HLD, HTN, chronic diastolic CHF Sinus bradycardia P:  PRN labetalol, hydralazine for SBP > 170 Hydralazine q6hrs, clonidine patch BP control  RENAL A:   Hypokalemia AKI but volume up, FENA 1.2% Non-AG acidosis, likely some degree starvation ketosis.  P:   Monitor BMET and UOP Persistent low K, replace many runs Repeat Mag in AM Lasix as tolerating, 1 dose today 1/16 Continue low dose Na bicarb gtt  GASTROINTESTINAL A:   ?Upper GI bleed at presentation Hx of Hep C Hx of pancreatitis S/p Whipple procedure in 2011 Severe protein calorie malnutrition Ileus> likely narcotic related P:   Continue metoclopramide, having reccurrent emesis Tube feeds > post-pyloric tube, attempt trickle feeds Minimize fentanyl Protonix GI ppx GI with no plan for EGD at this time pending stabilization of other issues.   HEMATOLOGIC A:   Acute blood loss anemia from reported upper GI bleed P:  F/u CBC Transfuse for Hb < 7 or bleeding SCD's for DVT prevention  INFECTIOUS A:   HCAP  RVP negative L BKA stump infection P:   Completed 10 days ceftriaxone for serratia and GBS in L stump Will need an AKA per ortho, pending improvement in status Resume bair hugger for hypothermia F/U CXR  ENDOCRINE A:   Hypoglycemia Hx of DM type II P:   D10W for hypoglycemia SSI  NEUROLOGIC A:   Acute metabolic encephalopathy Possible seizure activity 1/13 am, EEG 1/14 no clear Sz activity ? Hx EtOH, at risk withdrawal P:   RASS goal: -1 Decrease sedation and reassess later today Completed folate and thiamine Continue Keppra Repeat CT head, EEG 1/16    Family contact - Hassie BruceBrother, Michael >> [phone]   Fuller Planhristopher W Rice, MD PGY-I Internal Medicine Resident Pager# [phone] 07/24/2015, 7:53 AM    Attending Note:  I have examined  patient, reviewed labs, studies and notes. I have discussed the case with Dr Dimple Caseyice, and I agree with the data and plans as I have amended above. Pt with encephalopathy, VDRF, profound weakness all in setting suspected HCAP and stump infxn - both treated. He has been treated for seizures. Has remains hypothermic, hypoglycemic. He failed extubation on 1/15. On my eval today he is completely unresponsive off sedation (last given for intubation around 4:30). Given his debility, failure to improve, poor prognosis I favor initiating discussions with his family regarding goals for his care. I am concerned that he will not improve to the point of vent liberation or to d/c from the hospital. We will call his brother. In the meantime we will check TSH, cortisol. Check CT head and EEG. Consider re-involving neuro. He is in no position to have AKA. Independent critical care time is 40 minutes.   Levy Pupaobert Neveyah Garzon, MD, PhD 07/24/2015, 12:46 PM [facility] [phone] or if no answer [phone]

## 2015-07-24 NOTE — Progress Notes (Signed)
Patient found to have labored breathing, tachypneic, using accessory muscles, and unresponsive.  Dr. Zonia KiefStephens notified and patient was intubated.  Will follow through with orders and continue to monitor.

## 2015-07-24 NOTE — Progress Notes (Signed)
PT Cancellation Note  Patient Details Name: Victor CornfieldRobert Mcdonald MRN: [id number] DOB: 1960-09-21   Cancelled Treatment:    Reason Eval/Treat Not Completed: Patient not medically ready Pt re-intubated this AM due to respiratory distress. Not responsive per RN. Will follow up.   Blake DivineShauna A Yulonda Wheeling 07/24/2015, 1:58 PM  Mylo RedShauna Akiel Fennell, PT, DPT [phone]

## 2015-07-24 NOTE — Progress Notes (Signed)
EEG completed, results pending. 

## 2015-07-24 NOTE — Progress Notes (Signed)
RT assisted with intubation. Patient has good bilateral breath sounds. Tube is secured at 22cm at the lip. RT will continue to monitor as needed.

## 2015-07-24 NOTE — Progress Notes (Signed)
Inpatient Diabetes Program Recommendations  AACE/ADA: New Consensus Statement on Inpatient Glycemic Control (2015)  Target Ranges:  Prepandial:   less than 140 mg/dL      Peak postprandial:   less than 180 mg/dL (1-2 hours)      Critically ill patients:  140 - 180 mg/dL   Review of Glycemic Control  Inpatient Diabetes Program Recommendations:  Correction (SSI): discontinue Novolog Thank you  Piedad ClimesGina Harles Evetts BSN, RN,CDE Inpatient Diabetes Coordinator [phone] (team pager)

## 2015-07-24 NOTE — Progress Notes (Signed)
eLink Physician-Brief Progress Note Patient Name: Kathreen CornfieldRobert Laux DOB: 01-27-1961 MRN: [id number]   Date of Service  07/24/2015  HPI/Events of Note  Low cbg  eICU Interventions  Increase D10 to 100/h     Intervention Category Intermediate Interventions: Other:  Camile Esters V. 07/24/2015, 8:18 PM

## 2015-07-24 NOTE — Progress Notes (Signed)
Utilization review completed.  

## 2015-07-25 ENCOUNTER — Inpatient Hospital Stay (HOSPITAL_COMMUNITY): Payer: Medicare Other

## 2015-07-25 DIAGNOSIS — E440 Moderate protein-calorie malnutrition: Secondary | ICD-10-CM | POA: Insufficient documentation

## 2015-07-25 LAB — GLUCOSE, CAPILLARY
GLUCOSE-CAPILLARY: 110 mg/dL — AB (ref 65–99)
GLUCOSE-CAPILLARY: 118 mg/dL — AB (ref 65–99)
GLUCOSE-CAPILLARY: 119 mg/dL — AB (ref 65–99)
GLUCOSE-CAPILLARY: 121 mg/dL — AB (ref 65–99)
Glucose-Capillary: 126 mg/dL — ABNORMAL HIGH (ref 65–99)
Glucose-Capillary: 124 mg/dL — ABNORMAL HIGH (ref 65–99)

## 2015-07-25 LAB — BASIC METABOLIC PANEL
ANION GAP: 7 (ref 5–15)
Anion gap: 10 (ref 5–15)
BUN: 35 mg/dL — AB (ref 6–20)
BUN: 34 mg/dL — AB (ref 6–20)
CHLORIDE: 111 mmol/L (ref 101–111)
CO2: 16 mmol/L — ABNORMAL LOW (ref 22–32)
CO2: 16 mmol/L — AB (ref 22–32)
Calcium: 6.8 mg/dL — ABNORMAL LOW (ref 8.9–10.3)
Calcium: 7.1 mg/dL — ABNORMAL LOW (ref 8.9–10.3)
Chloride: 111 mmol/L (ref 101–111)
Creatinine, Ser: 1.89 mg/dL — ABNORMAL HIGH (ref 0.61–1.24)
Creatinine, Ser: 1.87 mg/dL — ABNORMAL HIGH (ref 0.61–1.24)
GFR calc Af Amer: 45 mL/min — ABNORMAL LOW (ref >60)
GFR calc Af Amer: 46 mL/min — ABNORMAL LOW (ref >60)
GFR calc non Af Amer: 39 mL/min — ABNORMAL LOW (ref >60)
GFR, EST NON AFRICAN AMERICAN: 39 mL/min — AB (ref >60)
GLUCOSE: 139 mg/dL — AB (ref 65–99)
GLUCOSE: 122 mg/dL — AB (ref 65–99)
POTASSIUM: 2.4 mmol/L — AB (ref 3.5–5.1)
POTASSIUM: 3.2 mmol/L — AB (ref 3.5–5.1)
Sodium: 134 mmol/L — ABNORMAL LOW (ref 135–145)
Sodium: 137 mmol/L (ref 135–145)

## 2015-07-25 LAB — AMMONIA: AMMONIA: 55 umol/L — AB (ref 9–35)

## 2015-07-25 LAB — CBC
HCT: 26.1 % — ABNORMAL LOW (ref 39.0–52.0)
HEMOGLOBIN: 9.2 g/dL — AB (ref 13.0–17.0)
MCH: 28.8 pg (ref 26.0–34.0)
MCHC: 35.2 g/dL (ref 30.0–36.0)
MCV: 81.6 fL (ref 78.0–100.0)
Platelets: 88 10*3/uL — ABNORMAL LOW (ref 150–400)
RBC: 3.2 MIL/uL — AB (ref 4.22–5.81)
RDW: 15.4 % (ref 11.5–15.5)
WBC: 4.8 10*3/uL (ref 4.0–10.5)

## 2015-07-25 LAB — CORTISOL-AM, BLOOD: Cortisol - AM: 27.5 ug/dL — ABNORMAL HIGH (ref 6.7–22.6)

## 2015-07-25 LAB — TSH: TSH: 1.867 u[IU]/mL (ref 0.350–4.500)

## 2015-07-25 LAB — MAGNESIUM: Magnesium: 1.5 mg/dL — ABNORMAL LOW (ref 1.7–2.4)

## 2015-07-25 LAB — VITAMIN B12: VITAMIN B 12: 2863 pg/mL — AB (ref 180–914)

## 2015-07-25 LAB — PHOSPHORUS: Phosphorus: 3.2 mg/dL (ref 2.5–4.6)

## 2015-07-25 MED ORDER — POTASSIUM CHLORIDE 10 MEQ/50ML IV SOLN
10.0000 meq | INTRAVENOUS | Status: AC
  Administered 2015-07-25 (×6): 10 meq via INTRAVENOUS
  Filled 2015-07-25 (×4): qty 50

## 2015-07-25 MED ORDER — POTASSIUM CHLORIDE 10 MEQ/50ML IV SOLN: 10.0000 meq | INTRAVENOUS | Status: DC

## 2015-07-25 MED ORDER — VITAL HIGH PROTEIN PO LIQD: 1000.0000 mL | ORAL | Status: DC

## 2015-07-25 MED ORDER — MAGNESIUM SULFATE 2 GM/50ML IV SOLN
2.0000 g | Freq: Once | INTRAVENOUS | Status: AC
  Administered 2015-07-25: 2 g via INTRAVENOUS
  Filled 2015-07-25: qty 50

## 2015-07-25 MED ORDER — DEXTROSE 10 % IV SOLN: INTRAVENOUS | Status: DC

## 2015-07-25 MED ORDER — POTASSIUM CHLORIDE 2 MEQ/ML IV SOLN
INTRAVENOUS | Status: DC
  Administered 2015-07-25 – 2015-07-26 (×2): via INTRAVENOUS
  Filled 2015-07-25 (×4): qty 1000

## 2015-07-25 MED ORDER — POTASSIUM CHLORIDE 10 MEQ/50ML IV SOLN
INTRAVENOUS | Status: AC
  Filled 2015-07-25: qty 50

## 2015-07-25 MED ORDER — POTASSIUM CHLORIDE 10 MEQ/50ML IV SOLN
10.0000 meq | INTRAVENOUS | Status: AC
  Administered 2015-07-25 (×2): 10 meq via INTRAVENOUS
  Filled 2015-07-25 (×2): qty 50

## 2015-07-25 MED ORDER — VITAL AF 1.2 CAL PO LIQD
1000.0000 mL | ORAL | Status: DC
  Administered 2015-07-25: 1000 mL

## 2015-07-25 MED ORDER — IOHEXOL 300 MG/ML  SOLN
25.0000 mL | INTRAMUSCULAR | Status: AC
  Administered 2015-07-25 (×2): 25 mL via ORAL

## 2015-07-25 NOTE — Progress Notes (Signed)
RT note: Pt. transported to CT and back from 2M14 uneventfully, RT to monitor.

## 2015-07-25 NOTE — Progress Notes (Signed)
Pt transported to and from CT without incident ?

## 2015-07-25 NOTE — Progress Notes (Signed)
CRITICAL VALUE ALERT  Critical value received:  Potassium 2.4  Date of notification:  07/25/15  Time of notification:  0530  Critical value read back:Yes.    Nurse who received alert:  A Hale Bogus RN  MD notified (1st page):  Allena Katz MD  Time of first page:  [phone]  MD notified (2nd page):  Time of second page:  Responding MD:  Allena Katz MD  Time MD responded:  [phone]

## 2015-07-25 NOTE — Progress Notes (Signed)
Nutrition Consult/Follow Up  DOCUMENTATION CODES:   Non-severe (moderate) malnutrition in context of chronic illness  INTERVENTION:    Re-start Vital AF 1.2 formula at 10 ml/hr after CT of abdomen   As medically appropriate, advance Vital AF 1.2 formula by 10 ml every 12 hours to goal rate of 60 ml/hr   TF regimen to provide 1728 kcals, 108 gm protein, 1168 ml of free water  NUTRITION DIAGNOSIS:   Inadequate oral intake related to inability to eat as evidenced by NPO status, ongoing  GOAL:   Patient will meet greater than or equal to 90% of their needs, progressing  MONITOR:   Vent status, TF tolerance, Labs, Weight trends, I & O's, Skin  ASSESSMENT:   55 yo Male was brought to ER. He was hypoxic. He required intubation. Also noted to have hematemesis. Reported that stool occult blood was negative. He was anemic and given PRBC transfusion. He was noted to be hypoglycemic. His CXR showed infiltrates. He had blood cx's done at outside hospital, and given vancomycin, zosyn. He had CT head that was negative. He was transferred to [facility].  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  Patient is currently intubated on ventilator support MV:  L/min Temp (24hrs), Avg:97 F (36.1 C), Min:94 F (34.4 C), Max:98.1 F (36.7 C)   Vital AF 1.2 formula currently stopped.  CORTRAK tube remains in place -- tip projects over duodenal bulb.  Pt with continued TF intolerance (ie vomiting). Spoke with Orest Dikes, MD.  Plan is for CT of abdomen at 1700 today -- will initiate trickle feeds afterwards.  Diet Order:  Diet NPO time specified  Skin:  Wound (see comment) (L stump wound)  Last BM:  1/17  Height:   Ht Readings from Last 1 Encounters:  08/05/15  (1.753 m)    Weight:   Wt Readings from Last 1 Encounters:  07/25/15 139 lb 8.8 oz (63.3 kg)    Ideal Body Weight:  73 kg  BMI:  Body mass index is 20.6 kg/(m^2).  Estimated Nutritional  Needs:   Kcal:  1834  Protein:  95-105 gm  Fluid:  per MD  EDUCATION NEEDS:   No education needs identified at this time  Maureen Chatters, RD, LDN Pager #: [phone] After-Hours Pager #: [phone]

## 2015-07-25 NOTE — Progress Notes (Signed)
PT Cancellation Note  Patient Details Name: Victor Mcdonald MRN: [id number] DOB: July 03, 1961   Cancelled Treatment:    Reason Eval/Treat Not Completed: Medical issues which prohibited therapy;Patient not medically ready Pt re-intubated yesterday. Continues to not be very responsive. Per RN, thinking about palliative care. Will sign off for now. Please re consult when pt appropriate for PT services.    [name] [name] 07/25/2015, 10:47 AM  Mylo Red, PT, DPT [phone]

## 2015-07-25 NOTE — Progress Notes (Signed)
PULMONARY / CRITICAL CARE MEDICINE   Name: Victor Mcdonald MRN: [id number] DOB: [birth year]    ADMISSION DATE:  07/13/2015  REFERRING MD:  Outside hospital ER, [facility] [place]  CHIEF COMPLAINT:  Can't breathe  SUBJECTIVE: Patient intermittently responsive off sedation o/n, hypoglycemic down to 40-60s so dextrose 10% infusion rate increased, CT head obtained due to his worsened level of consciousness compared to 2 days ago  VITAL SIGNS: BP 126/83 mmHg  Pulse 113  Temp(Src) 97.3 F (36.3 C) (Oral)  Resp 29  Ht  (1.753 m)  Wt 63.3 kg (139 lb 8.8 oz)  BMI 20.60 kg/m2  SpO2 96%  VENTILATOR SETTINGS: Vent Mode:  [-] PRVC FiO2 (%):  [60 %-100 %] 60 % Set Rate:  [14 bmp] 14 bmp Vt Set:  [510 mL] 510 mL PEEP:  [5 cmH20] 5 cmH20 Plateau Pressure:  [15 cmH20-27 cmH20] 22 cmH20  INTAKE / OUTPUT: I/O last 3 completed shifts: In: 5641.8 [I.V.:3996.8; Other:500; NG/GT:180; IV Piggyback:965] Out: 4875 [Urine:3425; Emesis/NG output:100; Stool:1350]  PHYSICAL EXAMINATION: GEN: chronically ill appearing, on vent HEENT: no JVD, ETT in place, pupils reactive PULM: fair air movement, coarse crackles in other fields, vent supported breaths CV: RRR, no murmurs/rubs/gallops GI: hypoactive bowel, soft, nontender MSK: normal bulk and tone, LLE BKA stump with open site, without much drainage SKIN: 1+ edema, no rashes, cool dry skin NEURO: RASS -5, off sedation  LABS:  BMET  Recent Labs Lab 07/23/15 2030 07/24/15 0540 07/25/15 0437  NA 142 143 134*  K 2.5* 2.8* 2.4*  CL 121* 122* 111  CO2 14* 16* 16*  BUN 34* 35* 35*  CREATININE 1.74* 1.75* 1.89*  GLUCOSE 116* 107* 139*    Electrolytes  Recent Labs Lab 07/22/15 0450  07/23/15 0455 07/23/15 2030 07/24/15 0540 07/25/15 0437  CALCIUM 7.5*  < > 7.4* 7.1* 7.1* 6.8*  MG 1.7  --  1.6* 1.9 1.8 1.5*  PHOS 4.5  --  3.7  --   --  3.2  < > = values in this interval not displayed.  CBC  Recent Labs Lab 07/23/15 0455 07/24/15 0540  07/25/15 0437  WBC 11.2* 9.7 4.8  HGB 8.8* 8.3* 9.2*  HCT 25.4* 23.7* 26.1*  PLT 144* 120* 88*    Coag's No results for input(s): APTT, INR in the last 168 hours.  Sepsis Markers No results for input(s): LATICACIDVEN, PROCALCITON, O2SATVEN in the last 168 hours.  ABG  Recent Labs Lab 07/23/15 2158 07/24/15 0620  PHART 7.341* 7.401  PCO2ART 23.3* 21.4*  PO2ART 95.0 324*    Liver Enzymes No results for input(s): AST, ALT, ALKPHOS, BILITOT, ALBUMIN in the last 168 hours.  Cardiac Enzymes No results for input(s): TROPONINI, PROBNP in the last 168 hours.  Glucose  Recent Labs Lab 07/24/15 1639 07/24/15 1739 07/24/15 1958 07/24/15 2023 07/25/15 0038 07/25/15 0353  GLUCAP 42* 76 62* 140* 110* 118*    Imaging 1/16 CXR > reviewed, ETT in good position, diffuse airspace disease with good expansion bilaterally  STUDIES:  1/04 CT head >> negative 1/04 Abd u/s >> small ascites, normal CBG, no focal hepatic mass, absent GB 1/6 Echo> LVEF 55-60%, LA mildly dilated, no pFO, RVSP 39, small to mod effusion Head CT 1/13 >> No acute bleed or other change.  1/13 EEG >> non-specific encephalopathy, no seizures.   CULTURES: 1/04 Blood cx (OSH) >> 1/04 Sputum >> serratia >> pan-sens.  1/04 Lt stump wound > group b strep  1/7 RVP > negative  ANTIBIOTICS:  1/4 Vancomycin >> 1/8 1/4 Zosyn >> 1/7 1/7 ceftriaxone > 1/13  SIGNIFICANT EVENTS: 1/04 Transfer from OSH, GI and ortho consulted, started D10 infusion 1/05 Transfuse 1 unit PRBC 1/06 Hypoglycemia  LINES/TUBES: 1/04 ETT >> 1/15, 1/16 >>  1/04 Lt IJ CVL >>   DISCUSSION: 55 yo male transferred from outside hospital with acute hypoxic respiratory failure, b/l pulmonary infiltrates, possible upper GI bleeding, acute encephalopathy, hypoglycemia, and possible Lt BKA stump infection.   ASSESSMENT / PLAN:  PULMONARY A: Acute hypoxic respiratory failure> ARDS vs pulm edema Diffuse airspace disease P:   Full vent  support PRN albuterol Hold lasix today for renal fxn  CARDIOVASCULAR A:  Hx of HLD, HTN, chronic diastolic CHF Sinus bradycardia P:  PRN labetalol, hydralazine for SBP > 170 Hydralazine q6hrs, clonidine patch BP control  RENAL A:   Hypokalemia AKI but volume up, FENA 1.2% Non-AG acidosis, likely some degree starvation ketosis.  P:   Monitor BMET and UOP Persistent low K, replace many runs Replace Mag Hold lasix No progress on bicarb gtt  GASTROINTESTINAL A:   ?Upper GI bleed at presentation Hx of Hep C Hx of pancreatitis S/p Whipple procedure in 2011 Severe protein calorie malnutrition Ileus> likely narcotic related P:   Continue metoclopramide, having reccurrent emesis Tube feeds > post-pyloric tube, attempt trickle feeds Minimize fentanyl Protonix GI ppx GI with no plan for EGD at this time pending stabilization of other issues If tolerating per tube can consider lactulose for ammonia, AMS  HEMATOLOGIC A:   Acute blood loss anemia from reported upper GI bleed P:  F/u CBC Transfuse for Hb < 7 or bleeding SCD's for DVT prevention  INFECTIOUS A:   HCAP  RVP negative L BKA stump infection P:   Completed 10 days ceftriaxone for serratia and GBS in L stump Will need an AKA per ortho, pending improvement in status F/U CXR  ENDOCRINE A:   Hypoglycemia Hx of DM type II Cortisol and TSH appropriate P:   D10W for hypoglycemia, rate increased q4hr CBG monitor  NEUROLOGIC A:   Acute metabolic encephalopathy Possible seizure activity 1/13 am, EEG 1/14 no clear Sz activity ? Hx EtOH, at risk withdrawal P:   RASS goal: -1 No interaction off sedation Completed folate and thiamine Continue Keppra Repeat CT head, EEG 1/16    Family contact - Hassie Bruce >> [phone]   Fuller Plan, MD PGY-I Internal Medicine Resident Pager# [phone] 07/25/2015, 7:43 AM

## 2015-07-25 NOTE — Progress Notes (Signed)
Updated family: Brother Trinton Prewitt and sister Ethyl Thunderberg by phone today about the patient's poor progress and reintubation. He is currently unable to tolerate adequate enteric nutrition, failing to correct his metabolic derangements, is obtunded with minimal interaction off all sedation, and cannot tolerate weaning from ventilator support. Discussed that his situation is guarded at present with future management to include indefinite ventilator dependency in a hospital setting.  They had not been updated since his course worsened last week so I recommended a family discussion amongst themselves and plan for repeat contact tomorrow to begin addressing goals of care.  Communication has previously been limited by family living out of town. Phone contact was attempted several times yesterday without success. Now better contact numbers are provided for calls.

## 2015-07-26 ENCOUNTER — Inpatient Hospital Stay (HOSPITAL_COMMUNITY): Payer: Medicare Other

## 2015-07-26 DIAGNOSIS — G9340 Encephalopathy, unspecified: Secondary | ICD-10-CM

## 2015-07-26 LAB — RENAL FUNCTION PANEL
ANION GAP: 6 (ref 5–15)
BUN: 33 mg/dL — ABNORMAL HIGH (ref 6–20)
CALCIUM: 7 mg/dL — AB (ref 8.9–10.3)
CO2: 17 mmol/L — ABNORMAL LOW (ref 22–32)
CREATININE: 1.83 mg/dL — AB (ref 0.61–1.24)
Chloride: 111 mmol/L (ref 101–111)
GFR calc Af Amer: 47 mL/min — ABNORMAL LOW (ref >60)
GFR calc non Af Amer: 40 mL/min — ABNORMAL LOW (ref >60)
GLUCOSE: 117 mg/dL — AB (ref 65–99)
PHOSPHORUS: 2.2 mg/dL — AB (ref 2.5–4.6)
Potassium: 3.4 mmol/L — ABNORMAL LOW (ref 3.5–5.1)
SODIUM: 134 mmol/L — AB (ref 135–145)

## 2015-07-26 LAB — GLUCOSE, CAPILLARY
GLUCOSE-CAPILLARY: 102 mg/dL — AB (ref 65–99)
GLUCOSE-CAPILLARY: 108 mg/dL — AB (ref 65–99)
GLUCOSE-CAPILLARY: 112 mg/dL — AB (ref 65–99)
GLUCOSE-CAPILLARY: 33 mg/dL — AB (ref 65–99)
Glucose-Capillary: 103 mg/dL — ABNORMAL HIGH (ref 65–99)
Glucose-Capillary: 84 mg/dL (ref 65–99)
Glucose-Capillary: 55 mg/dL — ABNORMAL LOW (ref 65–99)
Glucose-Capillary: 111 mg/dL — ABNORMAL HIGH (ref 65–99)

## 2015-07-26 LAB — CBC
HCT: 23.1 % — ABNORMAL LOW (ref 39.0–52.0)
HEMOGLOBIN: 8.4 g/dL — AB (ref 13.0–17.0)
MCH: 28.9 pg (ref 26.0–34.0)
MCHC: 36.4 g/dL — AB (ref 30.0–36.0)
MCV: 79.4 fL (ref 78.0–100.0)
PLATELETS: 51 10*3/uL — AB (ref 150–400)
RBC: 2.91 MIL/uL — ABNORMAL LOW (ref 4.22–5.81)
RDW: 15.6 % — AB (ref 11.5–15.5)
WBC: 3.1 10*3/uL — ABNORMAL LOW (ref 4.0–10.5)

## 2015-07-26 MED ORDER — PRO-STAT SUGAR FREE PO LIQD
30.0000 mL | Freq: Two times a day (BID) | ORAL | Status: DC
  Administered 2015-07-26 (×2): 30 mL
  Filled 2015-07-26 (×4): qty 30

## 2015-07-26 MED ORDER — DEXTROSE 10 % IV SOLN
INTRAVENOUS | Status: AC
  Administered 2015-07-26: 19:00:00 via INTRAVENOUS

## 2015-07-26 MED ORDER — POTASSIUM CHLORIDE 20 MEQ/15ML (10%) PO SOLN
40.0000 meq | Freq: Once | ORAL | Status: AC
  Administered 2015-07-26: 40 meq
  Filled 2015-07-26: qty 30

## 2015-07-26 MED ORDER — POTASSIUM CHLORIDE 20 MEQ/15ML (10%) PO SOLN
40.0000 meq | Freq: Three times a day (TID) | ORAL | Status: DC
  Administered 2015-07-26 (×2): 40 meq
  Filled 2015-07-26 (×5): qty 30

## 2015-07-26 MED ORDER — DEXTROSE 50 % IV SOLN
INTRAVENOUS | Status: AC
  Filled 2015-07-26: qty 50

## 2015-07-26 MED ORDER — FUROSEMIDE 10 MG/ML IJ SOLN
40.0000 mg | Freq: Three times a day (TID) | INTRAMUSCULAR | Status: DC
  Administered 2015-07-26 (×2): 40 mg via INTRAVENOUS
  Filled 2015-07-26 (×5): qty 4

## 2015-07-26 MED ORDER — SODIUM CHLORIDE 0.9 % IV SOLN
0.0300 [IU]/min | INTRAVENOUS | Status: DC
  Administered 2015-07-26: 0.03 [IU]/min via INTRAVENOUS
  Filled 2015-07-26: qty 2

## 2015-07-26 MED ORDER — DEXTROSE 50 % IV SOLN
50.0000 mL | Freq: Once | INTRAVENOUS | Status: AC
  Administered 2015-07-26: 50 mL via INTRAVENOUS

## 2015-07-26 MED ORDER — FENTANYL CITRATE (PF) 100 MCG/2ML IJ SOLN
50.0000 ug | Freq: Once | INTRAMUSCULAR | Status: AC
  Administered 2015-07-26: 50 ug via INTRAVENOUS
  Filled 2015-07-26: qty 2

## 2015-07-26 MED ORDER — VITAL AF 1.2 CAL PO LIQD
1000.0000 mL | ORAL | Status: DC
  Administered 2015-07-26: 1000 mL

## 2015-07-26 MED ORDER — FUROSEMIDE 10 MG/ML IJ SOLN
40.0000 mg | Freq: Every day | INTRAMUSCULAR | Status: DC
  Administered 2015-07-26: 40 mg via INTRAVENOUS
  Filled 2015-07-26: qty 4

## 2015-07-26 MED ORDER — LACTULOSE 10 GM/15ML PO SOLN
20.0000 g | Freq: Three times a day (TID) | ORAL | Status: DC
Start: 2015-07-26 — End: 2015-07-27
  Administered 2015-07-26 (×2): 20 g via ORAL
  Filled 2015-07-26 (×5): qty 30

## 2015-07-26 MED ORDER — NOREPINEPHRINE BITARTRATE 1 MG/ML IV SOLN
2.0000 ug/min | INTRAVENOUS | Status: DC
  Filled 2015-07-26: qty 4

## 2015-07-26 MED ORDER — POTASSIUM PHOSPHATES 15 MMOLE/5ML IV SOLN
30.0000 mmol | Freq: Once | INTRAVENOUS | Status: AC
  Administered 2015-07-26: 30 mmol via INTRAVENOUS
  Filled 2015-07-26: qty 10

## 2015-07-26 MED ORDER — DEXTROSE 50 % IV SOLN
INTRAVENOUS | Status: AC
  Administered 2015-07-26: 50 mL
  Filled 2015-07-26: qty 50

## 2015-07-26 MED ORDER — SODIUM BICARBONATE 650 MG PO TABS
1300.0000 mg | ORAL_TABLET | Freq: Four times a day (QID) | ORAL | Status: DC
Start: 2015-07-26 — End: 2015-07-27
  Administered 2015-07-26 (×2): 1300 mg
  Filled 2015-07-26 (×9): qty 2

## 2015-07-26 MED ORDER — NOREPINEPHRINE BITARTRATE 1 MG/ML IV SOLN
2.0000 ug/min | INTRAVENOUS | Status: DC
  Administered 2015-07-26: 5 ug/min via INTRAVENOUS
  Filled 2015-07-26: qty 16

## 2015-07-26 MED ORDER — NOREPINEPHRINE BITARTRATE 1 MG/ML IV SOLN
2.0000 ug/min | INTRAVENOUS | Status: DC
  Filled 2015-07-26 (×2): qty 16

## 2015-07-26 MED ORDER — FUROSEMIDE 10 MG/ML IJ SOLN: 40.0000 mg | Freq: Two times a day (BID) | INTRAMUSCULAR | Status: DC

## 2015-07-26 NOTE — Progress Notes (Signed)
Hypoglycemic Event  CBG: cbg-33  Treatment: D50 IV 50 mL  Symptoms: None  Follow-up CBG: Time:2010 CBG Result:[id number]-111  Possible Reasons for Event: Unknown  Comments/MD notified:Dr. Thomasenia Sales, [place]

## 2015-07-26 NOTE — Progress Notes (Signed)
PULMONARY / CRITICAL CARE MEDICINE   Name: Victor Mcdonald MRN: [id number] DOB: 1961-01-06    ADMISSION DATE:  08/07/2015  REFERRING MD:  Outside hospital ER, [facility] [place]  CHIEF COMPLAINT:  Can't breathe  SUBJECTIVE: Patient onto trickle feeds overnight after CT abdomen, some non-purposeful agitation and activity overnight, sedation was resumed on intermittent basis  VITAL SIGNS: BP 151/84 mmHg  Pulse 100  Temp(Src) 98.1 F (36.7 C) (Oral)  Resp 35  Ht  (1.753 m)  Wt 63.1 kg (139 lb 1.8 oz)  BMI 20.53 kg/m2  SpO2 93%  VENTILATOR SETTINGS: Vent Mode:  [-] PRVC FiO2 (%):  [60 %] 60 % Set Rate:  [14 bmp] 14 bmp Vt Set:  [510 mL] 510 mL PEEP:  [5 cmH20] 5 cmH20 Plateau Pressure:  [19 cmH20-28 cmH20] 23 cmH20  INTAKE / OUTPUT: I/O last 3 completed shifts: In: 5671.3 [I.V.:4926.3; Other:110; NG/GT:120; IV Piggyback:515] Out: 2175 [Urine:1125; Stool:1050]  PHYSICAL EXAMINATION: GEN: chronically ill appearing, on vent HEENT: no JVD, ETT in place, pupils reactive PULM: extensive crackles in all lung fields, vent supported breaths CV: RRR, no murmurs/rubs/gallops GI: hypoactive bowel, soft, nontender MSK: normal bulk and tone, LLE BKA stump with open site, without much drainage SKIN: 1-3+ dependent edema in arms and legs NEURO: RASS -5, off sedation  LABS:  BMET  Recent Labs Lab 07/25/15 0437 07/25/15 1617 07/26/15 0500  NA 134* 137 134*  K 2.4* 3.2* 3.4*  CL 111 111 111  CO2 16* 16* 17*  BUN 35* 34* 33*  CREATININE 1.89* 1.87* 1.83*  GLUCOSE 139* 122* 117*    Electrolytes  Recent Labs Lab 07/23/15 0455 07/23/15 2030 07/24/15 0540 07/25/15 0437 07/25/15 1617 07/26/15 0500  CALCIUM 7.4* 7.1* 7.1* 6.8* 7.1* 7.0*  MG 1.6* 1.9 1.8 1.5*  --   --   PHOS 3.7  --   --  3.2  --  2.2*    CBC  Recent Labs Lab 07/24/15 0540 07/25/15 0437 07/26/15 0500  WBC 9.7 4.8 3.1*  HGB 8.3* 9.2* 8.4*  HCT 23.7* 26.1* 23.1*  PLT 120* 88* 51*    Coag's No  results for input(s): APTT, INR in the last 168 hours.  Sepsis Markers No results for input(s): LATICACIDVEN, PROCALCITON, O2SATVEN in the last 168 hours.  ABG  Recent Labs Lab 07/23/15 2158 07/24/15 0620  PHART 7.341* 7.401  PCO2ART 23.3* 21.4*  PO2ART 95.0 324*    Liver Enzymes  Recent Labs Lab 07/26/15 0500  ALBUMIN <1.0*    Cardiac Enzymes No results for input(s): TROPONINI, PROBNP in the last 168 hours.  Glucose  Recent Labs Lab 07/25/15 0813 07/25/15 1155 07/25/15 1559 07/25/15 1949 07/26/15 0017 07/26/15 0406  GLUCAP 121* 119* 126* 124* 108* 102*    Imaging 1/18 CXR > reviewed, ETT in good position, severe pulmonary edema  STUDIES:  1/4 CT head >> negative 1/4 Abd u/s >> small ascites, normal CBG, no focal hepatic mass, absent GB 1/6 Echo> LVEF 55-60%, LA mildly dilated, no pFO, RVSP 39, small to mod effusion Head CT 1/13 >> No acute bleed or other change.  1/13 EEG >> non-specific encephalopathy, no seizures.  1/17 Abd CT >> Poor gastric emptying, no distention or obstruction, body wall and pulmonary edema, colonic thickening indicative of colitis  CULTURES: 1/04 Blood cx (OSH) >> 1/04 Sputum >> serratia >> pan-sens.  1/04 Lt stump wound > group b strep  1/7 RVP > negative  ANTIBIOTICS: 1/4 Vancomycin >> 1/8 1/4 Zosyn >> 1/7 1/7  ceftriaxone > 1/13  SIGNIFICANT EVENTS: 1/04 Transfer from OSH, GI and ortho consulted, started D10 infusion 1/05 Transfuse 1 unit PRBC 1/06 Hypoglycemia  LINES/TUBES: 1/04 ETT >> 1/15, 1/16 >>  1/04 Lt IJ CVL >>   DISCUSSION: 55 yo male transferred from outside hospital with acute hypoxic respiratory failure, b/l pulmonary infiltrates, possible upper GI bleeding, acute encephalopathy, hypoglycemia, and possible Lt BKA stump infection.   ASSESSMENT / PLAN:  PULMONARY A: Acute hypoxic respiratory failure> ARDS vs pulm edema Diffuse airspace disease P:   Full vent support PRN albuterol Resume lasix  today given severity of pulm edema  CARDIOVASCULAR A:  Hx of HLD, HTN, chronic diastolic CHF Sinus bradycardia P:  PRN labetalol, hydralazine for SBP > 170  RENAL A:   Hypokalemia AKI but volume up, FENA 1.2% Non-AG acidosis, likely some degree starvation ketosis.  P:   Monitor BMET and UOP Persistent low K, now on 53mEq/L with fluids Replace Mag, phos Resume lasix at low rate No progress on bicarb gtt  GASTROINTESTINAL A:   ?Upper GI bleed at presentation Hx of Hep C Hx of pancreatitis S/p Whipple procedure in 2011, probably malabsorption chronically Severe protein calorie malnutrition Colitis on Abd CT 1/17 P:   Continue metoclopramide, having reccurrent emesis Protonix GI ppx GI with no plan for EGD at this time pending stabilization of other issues If tolerating per tube can consider lactulose for ammonia, AMS Patient needs nutrition severely at this time, i expect starvation ketosis, he is grossly edematous and volume overloaded from IV electrolyte and glucose replacement, will advance TF although continues to have increased risk of emesis and diarrhea  HEMATOLOGIC A:   Acute blood loss anemia from reported upper GI bleed P:  F/u CBC Transfuse for Hb < 7 or bleeding SCD's for DVT prevention  INFECTIOUS A:   HCAP  RVP negative L BKA stump infection P:   Completed 10 days ceftriaxone for serratia and GBS in L stump Will need an AKA per ortho, pending improvement in status F/U CXR  ENDOCRINE A:   Hypoglycemia Hx of DM type II Cortisol and TSH appropriate P:   D10W for hypoglycemia, rate increased q4hr CBG monitor  NEUROLOGIC A:   Acute metabolic encephalopathy Possible seizure activity 1/13 am, EEG 1/14 no clear Sz activity Headt CT unremarkable 1/17, some spontaneous activity resumed P:   RASS goal: -1 No interaction off sedation Completed folate and thiamine Continue Keppra   Discussed clinical course with his brother and sister by phone  1/17. Plan to contact again today and determine goals of care.   Fuller Plan, MD PGY-I Internal Medicine Resident Pager# [phone] 07/26/2015, 7:50 AM

## 2015-07-26 NOTE — Progress Notes (Addendum)
Nutrition Follow-up  DOCUMENTATION CODES:   Non-severe (moderate) malnutrition in context of chronic illness  INTERVENTION:  Advance Vital AF 1.2 formula by 10 ml every 12 hours to goal rate of 65 ml/hr. This will provide 1872 kcal, 117 grams of protein, and 1264 ml of water.   Provide 30 ml of Pro-stat BID for 2 days until goal rate of tube feeding is met. This will provide 30 grams of protein and 200 kcal.   Monitor magnesium, potassium, and phosphorus daily for at least 3 days, MD to replete as needed, as pt is at risk for refeeding syndrome given sub optimal intake of enteral nutrition > 2 weeks.   NUTRITION DIAGNOSIS:   Inadequate oral intake related to inability to eat as evidenced by NPO status.  Ongoin  GOAL:   Patient will meet greater than or equal to 90% of their needs  Unmet  MONITOR:   Vent status, TF tolerance, Labs, Weight trends, I & O's, Skin  REASON FOR ASSESSMENT:   Consult Enteral/tube feeding initiation and management  ASSESSMENT:   55 yo Male was brought to ER. He was hypoxic. He required intubation. Also noted to have hematemesis. Reported that stool occult blood was negative. He was anemic and given PRBC transfusion. He was noted to be hypoglycemic. His CXR showed infiltrates. He had blood cx's done at outside hospital, and given vancomycin, zosyn. He had CT head that was negative. He was transferred to [facility].  Per MD, pt has been tolerating tube feedings at 10 ml/hr and is ready to advance. Per nursing notes, last episode of emesis was 1/17 at 0400. Pt continues to have loose stool output.  Cortrak tube in place with Vital AF 1.2 @ 10 ml/hr.   Patient is currently intubated on ventilator support MV: 16.6 L/min Temp (24hrs), Avg:97.9 F (36.6 C), Min:97.2 F (36.2 C), Max:98.6 F (37 C)   Labs: low calcium, low phosphorus, low hemoglobin  Diet Order:  Diet NPO time specified  Skin:  Wound (see comment) (stage II pressure ulcer on  sacrum; open area on L BKA)  Last BM:  1/18  Height:   Ht Readings from Last 1 Encounters:  07/25/2015 _0  (1.753 m)    Weight:   Wt Readings from Last 1 Encounters:  07/26/15 139 lb 1.8 oz (63.1 kg)    Ideal Body Weight:  73 kg  BMI:  Body mass index is 20.53 kg/(m^2).  Estimated Nutritional Needs:   Kcal:  1893  Protein:  95-105 gm  Fluid:  per MD  EDUCATION NEEDS:   No education needs identified at this time  [place], LDN Inpatient Clinical Dietitian Pager: [phone] After Hours Pager: [phone]

## 2015-07-26 NOTE — Progress Notes (Signed)
I talked with Victor Mcdonald, pt's brother regarding pt's code status. He is healthcare power of attorney as pt is currently unable to make decisions. He states he does NOT want CPR but does want intubation in event of cardiac/respiratory arrest. Code status is consequently changed to partial code. I changed pt's code status in his chart.   Dr. Otis Brace PGY-3 IMTS Pager [phone]

## 2015-07-26 NOTE — Progress Notes (Signed)
RN called d/t pt desat 81-78% on 100% fio2.  Admin recruitment maneuver x 2- called [place] MD Vassie Loll) and increased peep to 10.  RN/MD attempting to clarify code status.  No other RT orders rec'd at this time.

## 2015-07-26 NOTE — Progress Notes (Addendum)
RN called d/t pt desat in 80's%.  Pt was placed on 100% fio2.  BBSH coarse crackles t/o-RN aware. Pt also very tachypnic- RN reports sedation meds have changed/stopped.   Also, unable to obtain sputum sample currently d/t pt w/ scant/no secretions.  Sputum trap in room/in line w/ vent.

## 2015-07-26 NOTE — Progress Notes (Signed)
eLink Physician-Brief Progress Note Patient Name: Victor Mcdonald DOB: 06-19-1961 MRN: [id number]   Date of Service  07/26/2015  HPI/Events of Note  PEA, bradycardic arrest of unclear etiology  Called into room with report of bradycardia and loss of pulse. Pt LCB...meds only. Had received atropine nad epi by time I had camera on and pulse had been restored   eICU Interventions  Check EKG NP to go to bedside to evaluate     Intervention Category Major Interventions: Code management / supervision  Billy Fischer 07/26/2015, 11:44 PM

## 2015-07-26 NOTE — Progress Notes (Signed)
eLink Physician-Brief Progress Note Patient Name: Pepper Wyndham DOB: 01-14-1961 MRN: [id number]   Date of Service  07/26/2015  HPI/Events of Note  k low  eICU Interventions  k     Intervention Category Intermediate Interventions: Electrolyte abnormality - evaluation and management  Nelda Bucks. 07/26/2015, 6:57 AM

## 2015-07-27 LAB — GLUCOSE, CAPILLARY: Glucose-Capillary: 73 mg/dL (ref 65–99)

## 2015-07-27 MED ORDER — DEXTROSE 50 % IV SOLN
INTRAVENOUS | Status: AC
  Administered 2015-07-27: 50 mL
  Filled 2015-07-27: qty 50

## 2015-07-27 MED ORDER — ATROPINE SULFATE 0.1 MG/ML IJ SOLN
1.0000 mg | Freq: Once | INTRAMUSCULAR | Status: AC
  Administered 2015-07-27: 1 mg via INTRAVENOUS

## 2015-07-27 MED ORDER — EPINEPHRINE HCL 1 MG/ML IJ SOLN
1.0000 mg | Freq: Once | INTRAMUSCULAR | Status: AC
  Administered 2015-07-27: 1 mg via INTRAVENOUS

## 2015-07-27 MED ORDER — DEXTROSE 10 % IV SOLN: INTRAVENOUS | Status: DC

## 2015-07-27 MED FILL — Medication: Qty: 1 | Status: AC

## 2015-07-31 LAB — CULTURE, RESPIRATORY W GRAM STAIN

## 2015-07-31 LAB — CULTURE, RESPIRATORY

## 2015-08-04 ENCOUNTER — Telehealth: Payer: Self-pay | Admitting: *Deleted

## 2015-08-04 NOTE — Telephone Encounter (Signed)
Informed her I did receive certificate and that per DS it needs to be signed by MR. It was sent to [facility] with Margie to be given to MR to sign with the fax number to send it back to. Nothing further needed.

## 2015-08-04 NOTE — Telephone Encounter (Signed)
[facility] # [phone] is calling for death certificate for creamation. Please call [facility].

## 2015-08-08 ENCOUNTER — Telehealth: Payer: Self-pay | Admitting: *Deleted

## 2015-08-08 NOTE — Telephone Encounter (Signed)
Erasmo Score has called asking if you will sign the death certificate ASAP and have it faxed back to [phone]. Robynn Pane says it is in your look at folder. Thanks.

## 2015-08-09 NOTE — Progress Notes (Signed)
Patient  passed at 0309 this morning. Patient's rhythm was asystole at this time, strip printed. Patient had no audible heart sounds upon auscultation or spontaneous breath sounds, pupils were fixed and non reactive. This was verified by Resident MD Darreld Mclean and myself at bedside. No family was present but had been notified on multiple attempts in regards to patient's status and outcome. Also called Casimiro Needle and Cordelia Pen, brother and sister respectively, in regards to patient's passing. Afterwards, notified MD Carolyne Fiscal of patient's death via Elink. CDS was called and postmortem checklist completed.

## 2015-08-09 NOTE — Discharge Summary (Signed)
  Name: Victor Mcdonald MRN: [id number] DOB: September 12, 1960 55 y.o.  Date of Admission: 08/11/2015  6:14 AM Date of Discharge: Aug 26, 2015 Attending Physician: Dr. Kalman Shan  Discharge Diagnosis: Active Problems:   Acute respiratory failure with hypoxia (HCC)   HCAP (healthcare-associated pneumonia)   Upper GI bleed   Hepatic cirrhosis (HCC)   Protein-calorie malnutrition (HCC)   BKA stump complication (HCC)   Sepsis (HCC)   Respiratory failure (HCC)   Pressure ulcer   Seizure (HCC)   Acute respiratory failure with hypoxemia (HCC)   Acute encephalopathy   Malnutrition of moderate degree   Encephalopathy acute  Cause of death: 1. HCAP 2. Sepsis 3. Hypoxic respiratory failure 4. Acute renal failure 5. BKA stump complication 6. Colitis 7. Severe protein-calorie malnutriton  Time of death: Aug 26, 2015   Disposition and follow-up:   VictorVictor Mcdonald was discharged from [facility] in expired condition.    Hospital Course: Victor Mcdonald was brought to [facility] from outside facility on 08/11/2015 due to worsening dyspnea and hypoxia. He was intubated and started treatment for HCAP with bilateral airspace disease demonstrated on radiography as well as purulent drainage from L BKA site. GI service was also consulted for recurrent vomiting and large OG suction volumes but invasive workup was deferred until patient is more stable. Similarly revision of his BKA site was deferred due to unstable medical status. He developed sepsis and hypotension requiring vasopressor support. Despite completion of full duration appropriate antibiotics course he continued to have tenuous respiratory, cardiovascular, and renal function. Due to persistent intolerance of enteral nutrition and medication he required increasing intravenous therapies to support his multiple metabolic derangements. Colitis was noted on abdominal CT imaging. He reached a maximal level of improvement in mental  status on 1/15 and was extubated but within 12 hours redeveloped respiratory failure and obtundation. This progressed with a low level of arousal and declining respiratory and renal function. Family was never present at bedside throughout but via phone discussed patient wishes and was transitioned to DNR status in light of his deteriorating status from multiple organ systems before passing away early AM on 08-25-22.  Signed: Fuller Plan, MD 07/31/2015, 7:25 PM

## 2015-08-09 NOTE — Progress Notes (Signed)
PCCM Interval Progress Note  Called to bedside by [facility] MD to assess situation.    Pt apparently had bradycardia to the point of near arrest.  Yesterday evening, Dr. Johna Roles had conversation with pt's brother Casimiro Needle regarding his shock state.  Decision at the time was to initiate vasopressors; however, to not perform CPR in the event of cardiac arrest.  Since pressors were initiated; BP has remained soft and pt has progressively had worsening bradycardia.  I called pt's brother Casimiro Needle to update him on events and current circumstances.  I had extensive discussions with him regarding Mr. Sia current circumstances and organ failures. We also discussed patient's prior wishes under circumstances such as this as well as planned palliative care meeting for 07/26/14 for goals of care.  Decision was made to not perform resuscitation if arrest were to occur, but to otherwise continue with current medical support / therapies (we will continue with vasopressor infusions, but not provide ACLS meds in the event of arrest).  Code status changed from LCB to DNR.  All questions from [facility] answered.   Rutherford Guys, [place] [facility] [place] Pulmonary & Critical Care Medicine Pager: [phone]  or [phone] 08/02/2015, 12:28 AM

## 2015-08-09 NOTE — Telephone Encounter (Signed)
Completed and I gave it to [facility] [phone] secretary Dois Davenport at around 8.10pm 08/09/2015. This can be picked up courier or you can have them fax to office  Dr. Kalman Shan, M.D., [facility].C.P Pulmonary and Critical Care Medicine Staff Physician [facility] [place] [facility] Pager: [phone], If no answer or between  15:00h - 7:00h: call [phone]  08/09/2015 8:56 PM

## 2015-08-09 NOTE — Progress Notes (Addendum)
Pt had a rhythm change approx. 23:37:18. Patient HR had been in 80s-100s but went into the 50s-60s dramatically. Mindy Hopper RN and myself were at bedside at this time. Pt was a partial code (intubation, meds only). Patient already intubated, epinephrine and atropine were given per ACLS guidelines. Notified MD Simmonds via Elink in regards to event. PA Rahul notified at bedside, will continue to monitor and assess. Family also called by PA Rahul

## 2015-08-09 DEATH — deceased

## 2015-08-10 ENCOUNTER — Telehealth: Payer: Self-pay

## 2015-08-10 NOTE — Telephone Encounter (Signed)
Called and spoke with Victor Mcdonald in [facility] at [phone], advised her that Dr. Marchelle Gearing gave Victor Mcdonald the Death Certificate for this patient at 8:10pm last night and that the Death Certificate needs to be faxed as soon as possible to [facility] at [phone] so patient can be cremated. Victor Mcdonald said that she would fax the Death Certificate and took down the fax number of the [facility].  [facility] and advised them that the Death Certificate has been signed and Victor Mcdonald from ICU will be faxing it to them today.  Nothing further needed.

## 2015-08-10 NOTE — Telephone Encounter (Signed)
On 08/10/2015 I received a death certificate from [employer] (original). The death certificate was brought by Dennis Bast with the [facility]. The death certificate is for cremation. The patient is a patient of Doctor Ramaswamy. The death certificate will be taken to [facility] ([id number] ) this am for signature. On 09-15-15 I received the death certificate back from Doctor Ramaswamy. I got the death certificate ready and called the Health Dept to let them know the death certificate is ready for pickup.

## 2015-09-06 DEATH — deceased

## 2015-10-23 ENCOUNTER — Telehealth: Payer: Self-pay | Admitting: Internal Medicine

## 2015-10-23 NOTE — Telephone Encounter (Signed)
Spoke with Trellis Paganinieginald [facility] at [facility].  Needing a physician statement signed by MD that signed Death Certificate - this statement is for insurance. Requesting that Dr Marchelle Gearingamaswamy fill this out and fax this back asap.  Tiburcio BashReginald is faxing this as URGENT with Death Certificate attached.  Please keep an eye out Robynn Panelise for this document.

## 2015-10-24 NOTE — Telephone Encounter (Signed)
Victor Mcdonald have you received this form, or does this need to be re-requested?  Thanks!

## 2015-10-25 NOTE — Telephone Encounter (Signed)
Called and spoke to HelperGerald and requested this be re-faxed as this is has not been received and apparently urgent. Earvin HansenGerald stated he would get this re-faxed.

## 2015-10-27 NOTE — Telephone Encounter (Signed)
Victor Mcdonald - has this death certificate been received and processed?  Please advise.

## 2015-10-30 NOTE — Telephone Encounter (Signed)
This has been received and has been placed in MR's look-ats. Will await for it to be returned to me.

## 2015-11-06 ENCOUNTER — Telehealth: Payer: Self-pay | Admitting: Internal Medicine

## 2015-11-06 NOTE — Telephone Encounter (Signed)
Spoke with Trellis Paganinieginald [facility] at [facility], states he is following up on physician statement faxed last week that needs to be signed asap.  Per last phone note this has been received by Robynn PaneElise and was waiting on MR's return to clinic. Robynn Panelise please advise when this has been signed and faxed.  Verified fax # below with Tiburcio Basheginald.  Thanks.

## 2015-11-07 NOTE — Telephone Encounter (Signed)
Robynn Panelise please advise if this has been signed by MR.  Thanks!

## 2015-11-07 NOTE — Telephone Encounter (Signed)
Duplicate message. Please see phone note from [phone]. Will sign off.

## 2015-11-08 NOTE — Telephone Encounter (Signed)
This is still being completed. Will update chart when done. Thanks.

## 2015-11-16 ENCOUNTER — Telehealth: Payer: Self-pay | Admitting: Internal Medicine

## 2015-11-17 NOTE — Telephone Encounter (Signed)
error 

## 2015-11-22 ENCOUNTER — Telehealth: Payer: Self-pay | Admitting: Internal Medicine

## 2015-11-22 NOTE — Telephone Encounter (Signed)
Spoke with Victor Mcdonald with Victor Mcdonald. Was advised Victor Mcdonald is out and will return tomorrow morning around 10:30. Was asked to please call back to speak with Victor Mcdonald tomorrow morning.   WCB

## 2015-11-23 NOTE — Telephone Encounter (Signed)
Spoke with Tiburcio BashReginald  [facility] for MR to sign the physicians statement  I checked with MR and he does not recall seeing anything on this pt  Called back and spoke with Tiburcio Basheginald, he states to call Jomarie LongsJoseph and have him refax the form  I called and spoke with Jomarie LongsJoseph and he will refax  He is going to call once it has been sent to ensure we receive it  Will await a call back

## 2015-11-24 NOTE — Telephone Encounter (Signed)
I have the paper work; will drop it off noon 11/24/2015

## 2015-11-29 NOTE — Telephone Encounter (Signed)
I do not see forms on this pt in MR look-at box.   MR have you dropped these off?  Thanks!

## 2015-12-05 NOTE — Telephone Encounter (Signed)
Dr. Marchelle Gearingamaswamy, was this death [facility]?  Please advise.

## 2015-12-06 NOTE — Telephone Encounter (Signed)
i believe on Friday before mem day weekend I gaave it to [facility]

## 2015-12-06 NOTE — Telephone Encounter (Signed)
This was placed in Victor Mcdonald's basket outside of her office. Marcelino DusterMichelle in medical records was contacted about this.

## 2016-04-15 IMAGING — CT CT ABD-PELV W/O CM
2 of 4 series · 9 of 46 positions shown, 10 images · non-contrast
Comparison: None.

CLINICAL DATA: Abdominal distention in a patient status post
Whipple procedure and history of pancreatitis.

EXAM:
CT ABDOMEN AND PELVIS WITHOUT CONTRAST
TECHNIQUE: Multidetector CT imaging of the abdomen and pelvis was performed
following the standard protocol without IV contrast.

[Series 201: routine, idose (2) · axial · 0.81mm/px · z∈[+78,+453]mm · 6 of 99 slices shown, 7 images]
[im 12/99  soft-tissue]
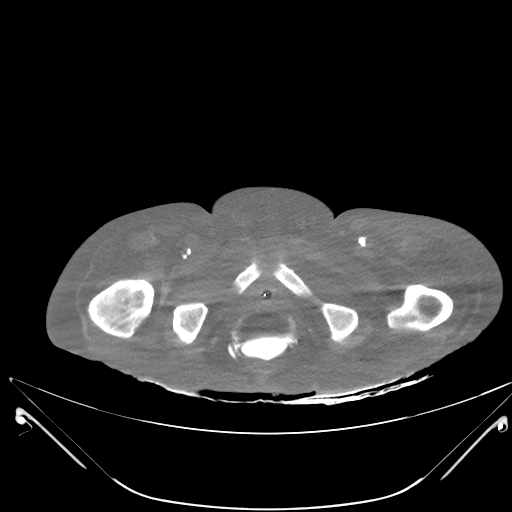
[im 12/99  bone]
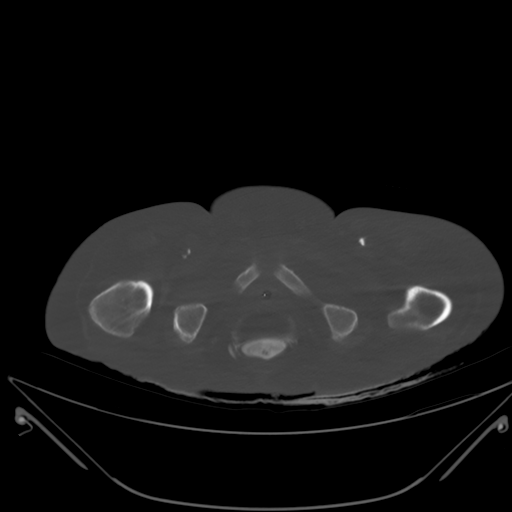
[im 27/99  soft-tissue]
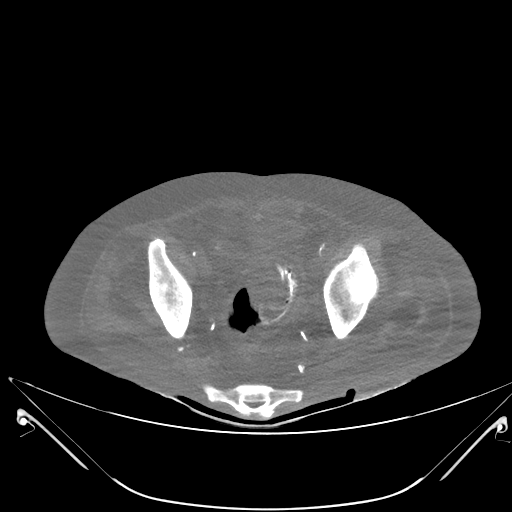
[im 42/99  soft-tissue]
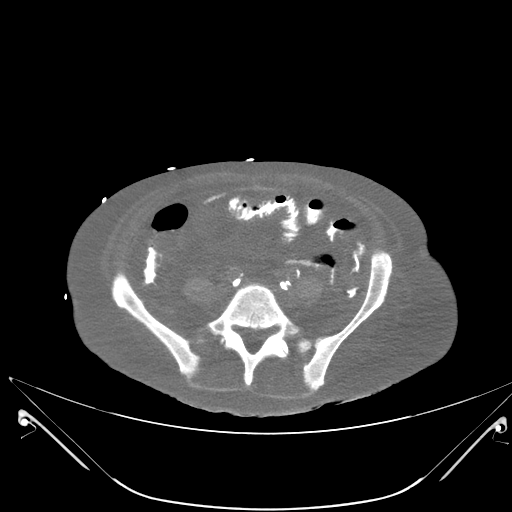
[im 57/99  soft-tissue]
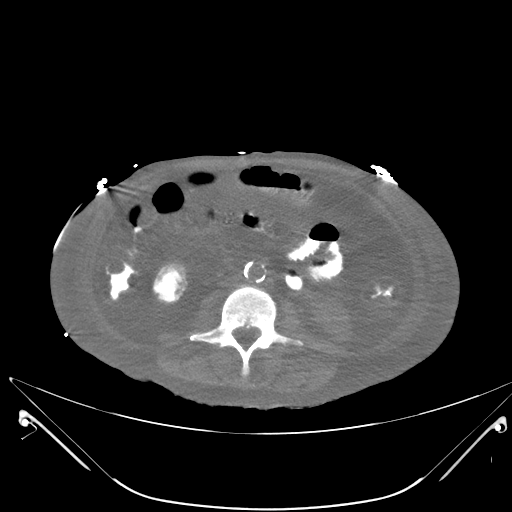
[im 72/99  soft-tissue]
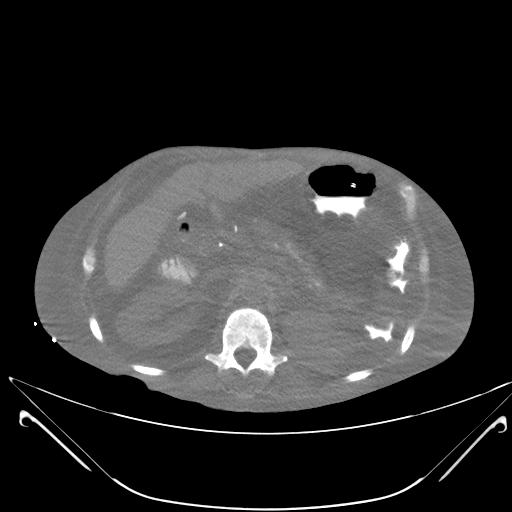
[im 87/99  soft-tissue]
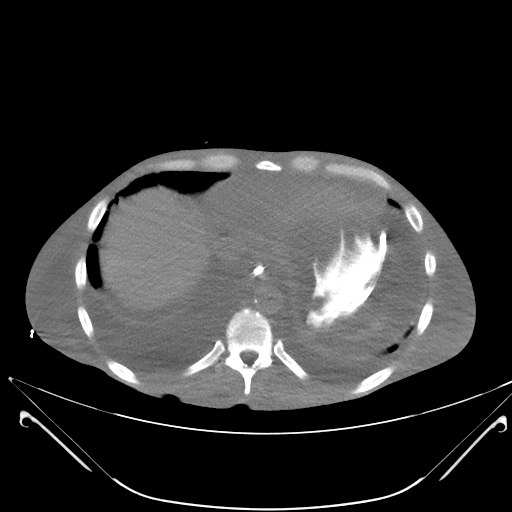

[Series 203: coronals, idose (2) · coronal · 0.45mm/px · 3 of 113 slices shown]
[im 38/113  soft-tissue]
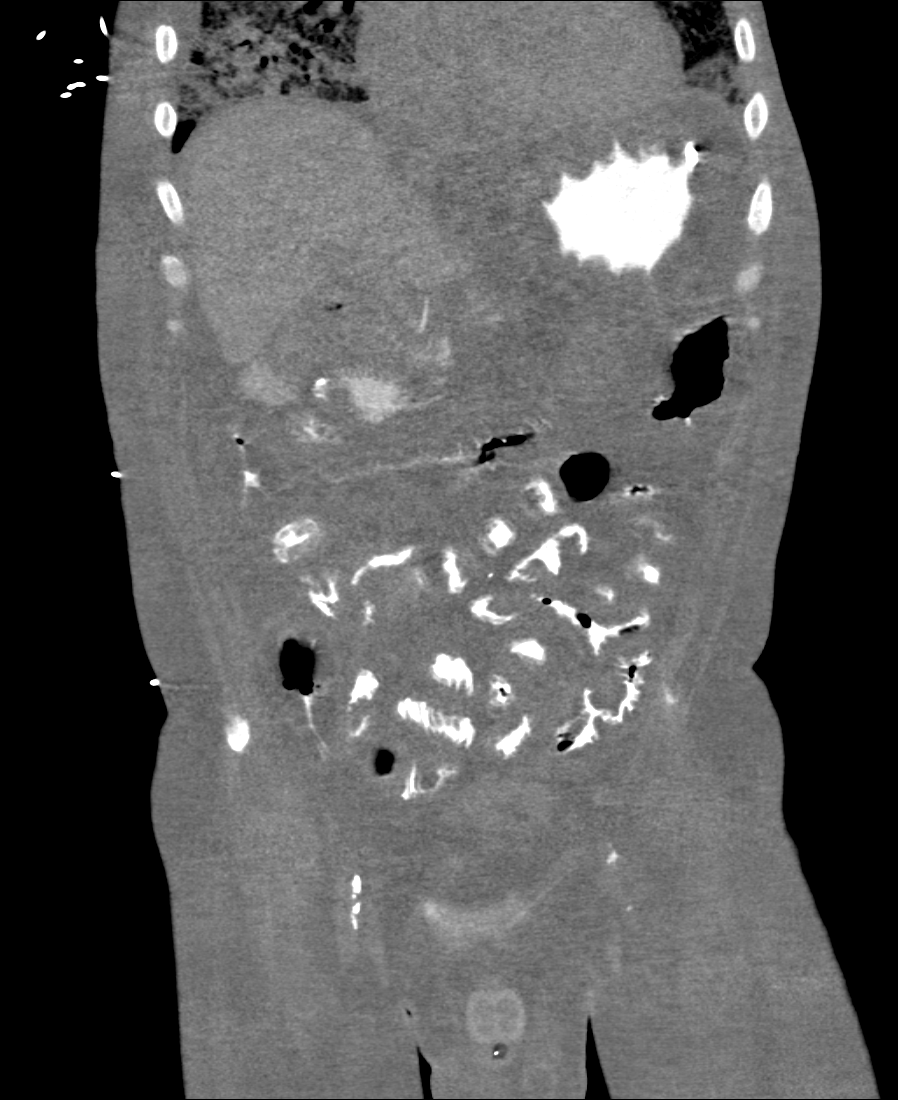
[im 50/113  soft-tissue]
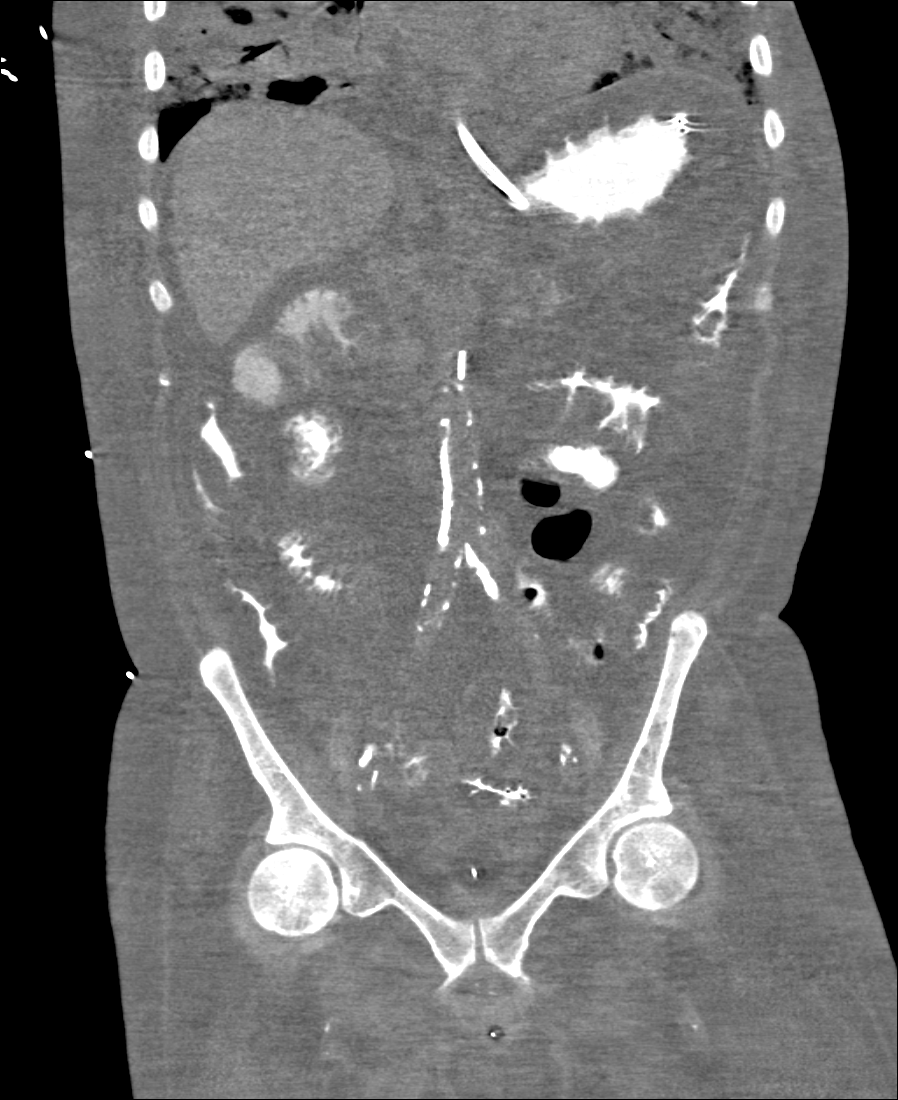
[im 63/113  soft-tissue]
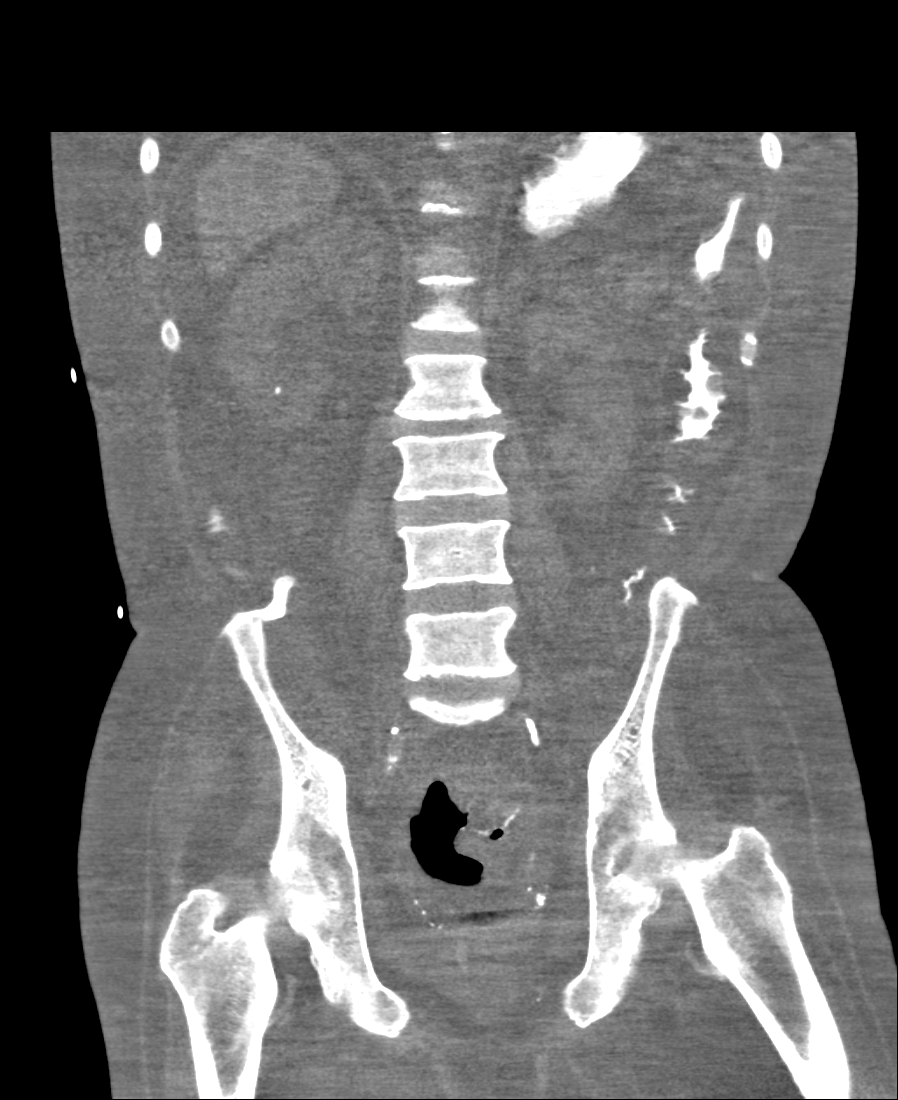

[9 of 46 positions shown; findings below may reference images not displayed]

FINDINGS: Note: Image quality is markedly degraded by lack of intravenous
contrast and diffuse soft tissue edema.

Lower chest: Dense airspace consolidation is seen in both lower
lungs with moderate bilateral pleural effusions.

Hepatobiliary: No focal abnormality in the liver on this study
without intravenous contrast. No evidence of hepatomegaly.
Gallbladder appears to be surgically absent. Branching gas in the
left liver appears distinct from the portal venous anatomy and is
likely pneumobilia in this patient with a reported history of
previous Whipple procedure.

Pancreas: Not well visualized.

Spleen: No splenomegaly.

Adrenals/Urinary Tract: No evidence for adrenal mass. No
hydronephrosis in either kidney. Small renal lesions could be masked
by the image noise on today's study. There is a tiny nonobstructing
stone in the lower pole the right kidney. No evidence for
hydroureteronephrosis. Bladder is decompressed by a Foley catheter

Stomach/Bowel: Stomach is moderately distended with contrast
material. No evidence for gastric outlet obstruction. The tip of the
feeding catheter is in the region of the distal stomach. No evidence
for small bowel obstruction. The wall of the colon appears diffusely
thickened and infectious/inflammatory colitis cannot be excluded.
Contrast material has migrated through to the level of the rectum.
The patient has a rectal tube in place.

Vascular/Lymphatic: There is abdominal aortic atherosclerosis
without aneurysm. No gross lymphadenopathy in the abdomen or pelvis
although assessment is limited.

Reproductive: Not well seen.

Other: No substantial intraperitoneal free fluid.

Musculoskeletal: Patient appears to have diffuse body wall in soft
tissue edema. Bone windows reveal no worrisome lytic or sclerotic
osseous lesions.
IMPRESSION: 1. Dense bilateral widespread airspace consolidation with moderate
to large bilateral pleural effusions.
2. No evidence for bowel obstruction.
3. Diffuse colonic wall thickening suggests infectious/inflammatory
colitis.
4. Diffuse body wall and soft tissue edema.

## 2016-04-16 IMAGING — CR DG CHEST 1V PORT
1 series · 1 of 1 positions shown · non-contrast
Comparison: Chest radiograph from one day prior.

CLINICAL DATA: Intubated

EXAM:
PORTABLE CHEST 1 VIEW

[AP]
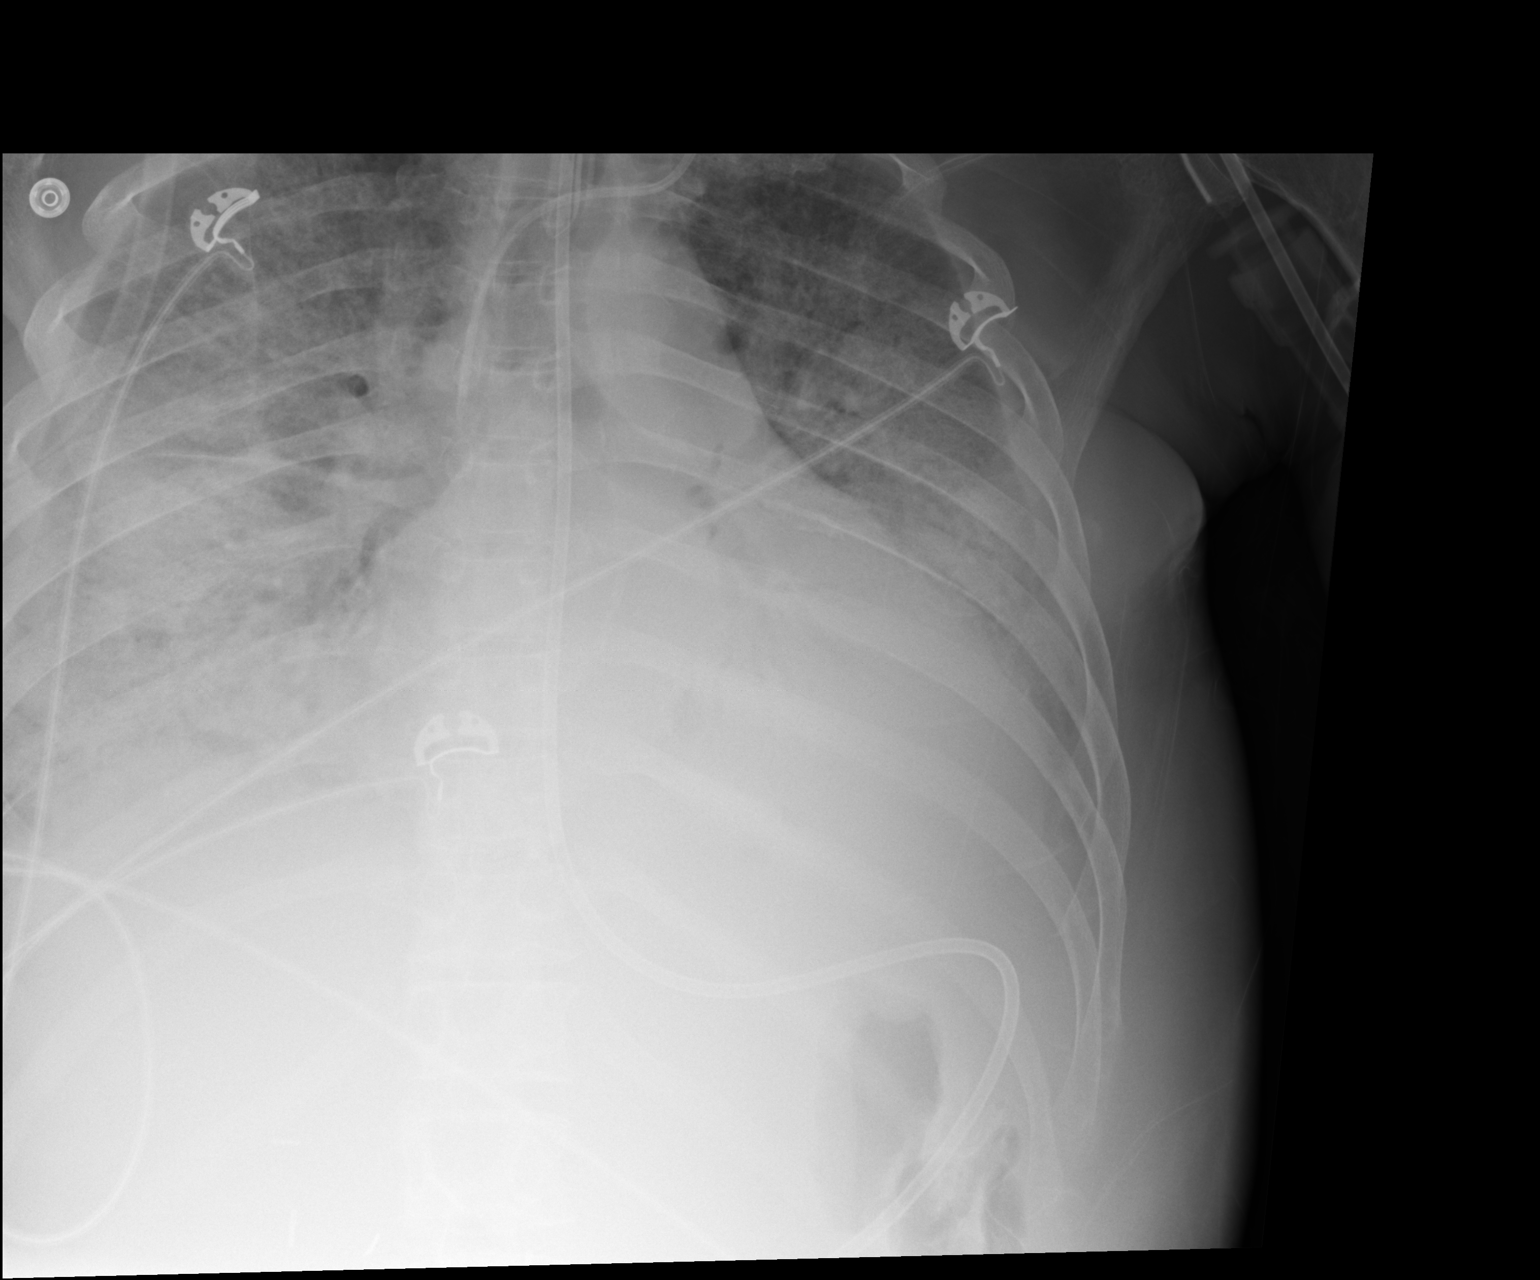

[1 of 1 positions shown; findings below may reference images not displayed]

FINDINGS: Endotracheal tube tip is 4.4 cm above the carina. Left internal
jugular central venous catheter terminates in the middle third of
the superior vena cava. Enteric tube enters the stomach with the tip
not seen on this image. Stable cardiomediastinal silhouette with
mild cardiomegaly. No pneumothorax. Small bilateral pleural
effusions, increased bilaterally. Severe diffuse fluffy airspace
opacities with scattered air bronchograms throughout both lungs, not
appreciably changed.
IMPRESSION: 1. Well-positioned support structures.
2. No appreciable change in severe diffuse fluffy airspace opacities
with scattered air bronchograms throughout both lungs, favor severe
pulmonary edema given the cardiomegaly, with the differential
including ARDS and/or multifocal pneumonia.
3. Small bilateral pleural effusions, increased bilaterally.
